# Patient Record
Sex: Female | Born: 1960
Health system: Southern US, Community
[De-identification: ages and names within clinical notes are randomized; demographics above are authoritative.]

## PROBLEM LIST (undated history)

## (undated) DIAGNOSIS — G4733 Obstructive sleep apnea (adult) (pediatric): Secondary | ICD-10-CM

## (undated) DIAGNOSIS — I10 Essential (primary) hypertension: Secondary | ICD-10-CM

## (undated) DIAGNOSIS — R112 Nausea with vomiting, unspecified: Secondary | ICD-10-CM

## (undated) DIAGNOSIS — M1990 Unspecified osteoarthritis, unspecified site: Secondary | ICD-10-CM

## (undated) DIAGNOSIS — L409 Psoriasis, unspecified: Secondary | ICD-10-CM

## (undated) DIAGNOSIS — F419 Anxiety disorder, unspecified: Secondary | ICD-10-CM

## (undated) DIAGNOSIS — E785 Hyperlipidemia, unspecified: Secondary | ICD-10-CM

## (undated) DIAGNOSIS — R7303 Prediabetes: Secondary | ICD-10-CM

## (undated) DIAGNOSIS — Z9889 Other specified postprocedural states: Secondary | ICD-10-CM

## (undated) DIAGNOSIS — M109 Gout, unspecified: Secondary | ICD-10-CM

## (undated) HISTORY — PX: SHOULDER SURGERY: SHX246

## (undated) HISTORY — PX: JOINT REPLACEMENT: SHX530

## (undated) HISTORY — PX: TONSILLECTOMY: SUR1361

## (undated) HISTORY — PX: INNER EAR SURGERY: SHX679

## (undated) HISTORY — PX: FOOT SURGERY: SHX648

## (undated) HISTORY — PX: CARPAL TUNNEL RELEASE: SHX101

---

## 2004-04-26 ENCOUNTER — Other Ambulatory Visit: Payer: Self-pay

## 2004-10-21 ENCOUNTER — Encounter: Payer: Self-pay | Admitting: Unknown Physician Specialty

## 2004-11-03 ENCOUNTER — Encounter: Payer: Self-pay | Admitting: Unknown Physician Specialty

## 2004-12-04 ENCOUNTER — Encounter: Payer: Self-pay | Admitting: Unknown Physician Specialty

## 2005-01-04 ENCOUNTER — Encounter: Payer: Self-pay | Admitting: Unknown Physician Specialty

## 2005-07-24 ENCOUNTER — Ambulatory Visit: Payer: Self-pay | Admitting: Neurosurgery

## 2011-04-05 ENCOUNTER — Emergency Department: Payer: Self-pay | Admitting: Internal Medicine

## 2011-07-19 ENCOUNTER — Ambulatory Visit: Payer: Self-pay | Admitting: Unknown Physician Specialty

## 2011-08-22 ENCOUNTER — Ambulatory Visit: Payer: Self-pay | Admitting: Unknown Physician Specialty

## 2011-08-24 ENCOUNTER — Ambulatory Visit: Payer: Self-pay | Admitting: Surgery

## 2012-02-22 ENCOUNTER — Ambulatory Visit: Payer: Self-pay | Admitting: Family

## 2012-11-04 ENCOUNTER — Ambulatory Visit: Payer: Self-pay | Admitting: Unknown Physician Specialty

## 2012-11-21 ENCOUNTER — Ambulatory Visit: Payer: Self-pay | Admitting: Unknown Physician Specialty

## 2012-11-21 LAB — URINALYSIS, COMPLETE
Bilirubin,UR: NEGATIVE
Glucose,UR: NEGATIVE mg/dL (ref 0–75)
Nitrite: NEGATIVE
Protein: NEGATIVE
RBC,UR: 1 /HPF (ref 0–5)
Specific Gravity: 1.008 (ref 1.003–1.030)
Squamous Epithelial: 4
WBC UR: 1 /HPF (ref 0–5)

## 2012-11-21 LAB — APTT: Activated PTT: 28.6 secs (ref 23.6–35.9)

## 2012-11-21 LAB — PROTIME-INR: Prothrombin Time: 11.7 secs (ref 11.5–14.7)

## 2012-11-21 LAB — CBC
HCT: 38.3 % (ref 35.0–47.0)
HGB: 12.6 g/dL (ref 12.0–16.0)
MCH: 30.2 pg (ref 26.0–34.0)
MCV: 92 fL (ref 80–100)
RBC: 4.19 10*6/uL (ref 3.80–5.20)
RDW: 13.3 % (ref 11.5–14.5)
WBC: 7 10*3/uL (ref 3.6–11.0)

## 2012-11-21 LAB — BASIC METABOLIC PANEL
Anion Gap: 6 — ABNORMAL LOW (ref 7–16)
BUN: 14 mg/dL (ref 7–18)
Chloride: 106 mmol/L (ref 98–107)
Creatinine: 0.45 mg/dL — ABNORMAL LOW (ref 0.60–1.30)
EGFR (African American): >60
EGFR (Non-African Amer.): >60
Osmolality: 277 (ref 275–301)
Potassium: 3.7 mmol/L (ref 3.5–5.1)

## 2012-11-26 ENCOUNTER — Inpatient Hospital Stay: Payer: Self-pay | Admitting: Unknown Physician Specialty

## 2012-11-26 LAB — ELECTROLYTE PANEL
Anion Gap: 11 (ref 7–16)
Chloride: 106 mmol/L (ref 98–107)
Co2: 23 mmol/L (ref 21–32)

## 2012-11-26 LAB — HEMOGLOBIN: HGB: 11.1 g/dL — ABNORMAL LOW (ref 12.0–16.0)

## 2012-11-27 LAB — BASIC METABOLIC PANEL
Anion Gap: 8 (ref 7–16)
BUN: 16 mg/dL (ref 7–18)
Calcium, Total: 7.8 mg/dL — ABNORMAL LOW (ref 8.5–10.1)
Chloride: 97 mmol/L — ABNORMAL LOW (ref 98–107)
Co2: 26 mmol/L (ref 21–32)
Creatinine: 0.61 mg/dL (ref 0.60–1.30)
Osmolality: 265 (ref 275–301)
Potassium: 3.8 mmol/L (ref 3.5–5.1)
Sodium: 131 mmol/L — ABNORMAL LOW (ref 136–145)

## 2012-11-28 LAB — BASIC METABOLIC PANEL
Anion Gap: 8 (ref 7–16)
BUN: 18 mg/dL (ref 7–18)
Calcium, Total: 8.3 mg/dL — ABNORMAL LOW (ref 8.5–10.1)
Chloride: 104 mmol/L (ref 98–107)
Co2: 24 mmol/L (ref 21–32)
Creatinine: 0.89 mg/dL (ref 0.60–1.30)
EGFR (Non-African Amer.): >60
Glucose: 129 mg/dL — ABNORMAL HIGH (ref 65–99)
Sodium: 136 mmol/L (ref 136–145)

## 2012-11-28 LAB — HEMOGLOBIN: HGB: 9 g/dL — ABNORMAL LOW (ref 12.0–16.0)

## 2014-08-18 ENCOUNTER — Ambulatory Visit (INDEPENDENT_AMBULATORY_CARE_PROVIDER_SITE_OTHER): Payer: 59 | Admitting: Podiatry

## 2014-08-18 ENCOUNTER — Ambulatory Visit (INDEPENDENT_AMBULATORY_CARE_PROVIDER_SITE_OTHER): Payer: 59

## 2014-08-18 ENCOUNTER — Encounter: Payer: Self-pay | Admitting: Podiatry

## 2014-08-18 VITALS — BP 176/159 | HR 83 | Resp 16 | Ht 66.0 in | Wt 215.0 lb

## 2014-08-18 DIAGNOSIS — M722 Plantar fascial fibromatosis: Secondary | ICD-10-CM

## 2014-08-18 DIAGNOSIS — M7660 Achilles tendinitis, unspecified leg: Secondary | ICD-10-CM

## 2014-08-18 MED ORDER — TRIAMCINOLONE ACETONIDE 10 MG/ML IJ SUSP
10.0000 mg | Freq: Once | INTRAMUSCULAR | Status: AC
  Administered 2014-08-18: 10 mg

## 2014-08-18 MED ORDER — DICLOFENAC SODIUM 75 MG PO TBEC: 75.0000 mg | DELAYED_RELEASE_TABLET | Freq: Two times a day (BID) | ORAL | Status: DC

## 2014-08-18 NOTE — Patient Instructions (Signed)

## 2014-08-18 NOTE — Progress Notes (Signed)
Subjective:     Patient ID: Norma Hickman, female   DOB: 1961/02/11, 53 y.o.   MRN: [id number]  Foot Pain   patient states I'm getting a lot of pain in the back of my right heel where it inserts into the bone that's been present for over a month. I've tried ultrasound ice therapy and physical therapy without relief of symptoms   Review of Systems  All other systems reviewed and are negative.      Objective:   Physical Exam  Nursing note and vitals reviewed. Constitutional: She is oriented to person, place, and time.  Cardiovascular: Intact distal pulses.   Musculoskeletal: Normal range of motion.  Neurological: She is oriented to person, place, and time.  Skin: Skin is warm.   neurovascular status intact with muscle strength adequate and range of motion of the subtalar and midtarsal joint within normal limits. Patient is found to have exquisite posterior heel pain on the posterior aspect of the heel medial side and into the central area with no pain on the lateral side noted. Patient's digits are well perfused arch height is normal and there is changes on x-ray     Assessment:     Severe acute Achilles tendinitis right with no involvement of the muscle    Plan:     H&P and x-rays reviewed. Today I carefully injected the medial side 3 mg dexamethasone Kenalog 5 mg Xylocaine after first discussing the risk of the injection and possibility for rupture. Patient wanted the injection which was accomplished and I also dispensed air fracture walker to use for the next 3 weeks and instructed on reduced activity. Placed on diclofenac 75 mg twice a day and reappoint in 3 weeks earlier if any issues should occur

## 2014-08-18 NOTE — Progress Notes (Signed)
   Subjective:    Patient ID: Norma Hickman, female    DOB: 07-Feb-1961, 53 y.o.   MRN: [id number]  HPI Comments: Its my right heel. Its very painful. The pain goes up the back of the leg. Ive had it for over 1 month. The pain is getting worse. Its hurts to walk. Lavenia Atlas been having therapy on my rt foot and it helps a little bit but it goes back to having pain.   Foot Pain      Review of Systems  HENT: Positive for hearing loss.   Musculoskeletal:       Joint pain Difficulty walking  All other systems reviewed and are negative.      Objective:   Physical Exam        Assessment & Plan:

## 2014-09-11 ENCOUNTER — Ambulatory Visit (INDEPENDENT_AMBULATORY_CARE_PROVIDER_SITE_OTHER): Payer: 59 | Admitting: Podiatry

## 2014-09-11 VITALS — BP 169/90 | HR 84 | Resp 16

## 2014-09-11 DIAGNOSIS — M7661 Achilles tendinitis, right leg: Secondary | ICD-10-CM

## 2014-09-11 DIAGNOSIS — M722 Plantar fascial fibromatosis: Secondary | ICD-10-CM

## 2014-09-11 NOTE — Progress Notes (Signed)
Subjective:     Patient ID: Norma Hickman, female   DOB: 08-09-1961, 53 y.o.   MRN: [id number]  HPI patient presents stating my heel is not better and only felt better in the boot and now was right back to where it was before   Review of Systems     Objective:   Physical Exam Neurovascular status intact muscle strength is adequate and patient is found to have continued severe discomfort on the posterior aspect of the right heel with large spur formation noted    Assessment:     Long-term history of Achilles tendinitis right which has failed to respond to conservative care    Plan:     Spent a great deal of time reviewing this and reviewing different options including continued cast immobilization versus open surgery versus shockwave therapy. Patient has opted for open surgery at this time due to the over a year history of this and her failure to do any type of activity because of pain. I allowed her to read a consent form and reviewed all possible complications as outlined on the consent form and the fact that the subcutaneous tissue is very thin on the back of the heel and the chance for dehiscence is high. Patient wants procedure understanding risk and understanding she'll be nonweightbearing for 6 weeks and total recovery. We'll take 6 months to one year. Patient is scheduled for outpatient surgery and is given all preoperative instructions and will bring her boot with her to surgery

## 2014-09-14 ENCOUNTER — Encounter: Payer: Self-pay | Admitting: Podiatry

## 2014-09-15 ENCOUNTER — Encounter: Payer: Self-pay | Admitting: Podiatry

## 2014-09-15 DIAGNOSIS — M7661 Achilles tendinitis, right leg: Secondary | ICD-10-CM

## 2014-09-15 DIAGNOSIS — M7731 Calcaneal spur, right foot: Secondary | ICD-10-CM

## 2014-09-21 NOTE — Progress Notes (Signed)
1. Tenolysis right achilles tendon with cross bridge anchor     2. Removal spur from back of right heel

## 2014-09-22 ENCOUNTER — Ambulatory Visit (INDEPENDENT_AMBULATORY_CARE_PROVIDER_SITE_OTHER): Payer: 59 | Admitting: Podiatry

## 2014-09-22 ENCOUNTER — Ambulatory Visit (INDEPENDENT_AMBULATORY_CARE_PROVIDER_SITE_OTHER): Payer: 59

## 2014-09-22 ENCOUNTER — Encounter: Payer: Self-pay | Admitting: Podiatry

## 2014-09-22 DIAGNOSIS — M7661 Achilles tendinitis, right leg: Secondary | ICD-10-CM

## 2014-09-22 NOTE — Progress Notes (Signed)
Subjective:     Patient ID: Norma Hickman, female   DOB: 1960/12/08, 53 y.o.   MRN: [id number]  HPI patient states I'm doing better than I thought and still having discomfort but it seems to be improving. Patient states that she's having no pain in her leg and has been nonweightbearing on this foot since surgery   Review of Systems     Objective:   Physical Exam Neurovascular status intact with muscle strength adequate and negative Homans sign noted and patient's noted to have well-healing surgical site right posterior heel with wound edges well coapted no drainage or gapping noted    Assessment:     Doing well post Teno lysis and heel spur resection right    Plan:     Reviewed condition and the importance of continued nonweightbearing. I did dispense a shorter air fracture walker as she is now past the critical stage and this will be easier to use since she is having knee replacement right performed in the past. Patient will be seen back in 2 weeks for suture removal and earlier if any other issues should occur like drainage increased swelling pain in the back of her leg or anything else associated with the procedure area

## 2014-10-09 ENCOUNTER — Ambulatory Visit (INDEPENDENT_AMBULATORY_CARE_PROVIDER_SITE_OTHER): Payer: 59 | Admitting: Podiatry

## 2014-10-09 VITALS — BP 178/104 | HR 79 | Resp 16

## 2014-10-09 DIAGNOSIS — M722 Plantar fascial fibromatosis: Secondary | ICD-10-CM

## 2014-10-09 DIAGNOSIS — M7661 Achilles tendinitis, right leg: Secondary | ICD-10-CM

## 2014-10-09 NOTE — Progress Notes (Signed)
Subjective:     Patient ID: Norma BallsVickie L Hickman, female   DOB: January 11, 1961, 53 y.o.   MRN: [id number]  HPIpatient states she's doing really well with her right foot and states it's healing well and her foot feels much better than it did before surgery   Review of Systems     Objective:   Physical Exam Neurovascular status intact with well-healing posterior incision site right heel with Achilles tendon which is in very good condition and no indications of trauma to    Assessment:     Doing well post Te no lysis and heel spur resection right with anchor system    Plan:     Reviewed condition and at this time I removed stitches with wound edges remain coapted well and applied sterile dressing. Instructed on continued reduced weightbearing with gradual pressure to be put on the foot but no activation of the Achilles tendon the next 4 weeks. Reappoint 4 weeks or earlier if any issues should occur

## 2014-10-12 ENCOUNTER — Ambulatory Visit (INDEPENDENT_AMBULATORY_CARE_PROVIDER_SITE_OTHER): Payer: 59 | Admitting: *Deleted

## 2014-10-12 VITALS — BP 178/104 | HR 79 | Resp 16

## 2014-10-12 DIAGNOSIS — M7661 Achilles tendinitis, right leg: Secondary | ICD-10-CM

## 2014-10-12 NOTE — Progress Notes (Signed)
PT PRESENTS FOR ONE SUTURE TO BE REMOVED. PT WAS IN OFFICE ON 11.6.15 AND ONE SUTURE WAS MISSED.

## 2014-11-06 ENCOUNTER — Ambulatory Visit (INDEPENDENT_AMBULATORY_CARE_PROVIDER_SITE_OTHER): Payer: 59

## 2014-11-06 ENCOUNTER — Ambulatory Visit (INDEPENDENT_AMBULATORY_CARE_PROVIDER_SITE_OTHER): Payer: 59 | Admitting: Podiatry

## 2014-11-06 VITALS — BP 112/90 | HR 86 | Resp 16

## 2014-11-06 DIAGNOSIS — M7661 Achilles tendinitis, right leg: Secondary | ICD-10-CM

## 2014-11-08 NOTE — Progress Notes (Signed)
Subjective:     Patient ID: Norma BallsVickie L Nee, female   DOB: 1961/08/02, 53 y.o.   MRN: [id number]  HPI patient states that her right heel is doing very well and she's walking reasonably well with minimal discomfort or swelling   Review of Systems     Objective:   Physical Exam Neurovascular status intact with well-healing surgical site right with a small amount of distal crust formation that's localized with no redness erythema or drainage noted surrounding the area. The range of motion of the ankle is good and the Achilles tendon appear strong    Assessment:     Doing well post heel spur resection with T no lysis of the Achilles tendon    Plan:     Advised this patient on continued watching the wound itself and keeping it nice and clean and padded. Dispensed anklet and gradual return to soft shoe and continued range of motion exercises but no forms of physical activity at this current time. Reviewed x-rays and reappoint in 6 weeks

## 2014-12-18 ENCOUNTER — Encounter: Payer: 59 | Admitting: Podiatry

## 2014-12-29 ENCOUNTER — Ambulatory Visit (INDEPENDENT_AMBULATORY_CARE_PROVIDER_SITE_OTHER): Payer: 59

## 2014-12-29 ENCOUNTER — Ambulatory Visit (INDEPENDENT_AMBULATORY_CARE_PROVIDER_SITE_OTHER): Payer: 59 | Admitting: Podiatry

## 2014-12-29 VITALS — BP 166/96 | HR 95 | Resp 16

## 2014-12-29 DIAGNOSIS — M7661 Achilles tendinitis, right leg: Secondary | ICD-10-CM

## 2014-12-30 NOTE — Progress Notes (Signed)
Subjective:     Patient ID: Norma BallsVickie L Sutch, female   DOB: May 05, 1961, 54 y.o.   MRN: [id number]  HPI patient states my right heel is doing real well   Review of Systems     Objective:   Physical Exam  Neurovascular status intact with muscle strength adequate and range of motion within normal limits. Patient is noted to have discomfort of a very minimal nature more proximal but the incision site itself is healed excellent with good strength of the Achilles tendon    Assessment:     Doing well after her Achilles tendon surgery right with posterior heel resection    Plan:     Final x-rays reviewed and allow patient to return to normal activities. Reappoint to recheck

## 2015-03-26 NOTE — Op Note (Signed)
PATIENT NAME:  Norma Hickman, Norma Hickman MR#:  [id number] DATE OF BIRTH:  Nov 10, 1961  DATE OF PROCEDURE:  11/26/2012  PREOPERATIVE DIAGNOSIS: Severe degenerative arthritis, right knee.   POSTOPERATIVE DIAGNOSIS: Severe degenerative arthritis, right knee.   PROCEDURE PERFORMED: Stryker PS total knee replacement on the right.   SURGEON: Alda Berthold., M.D.   ANESTHESIA: Spinal.   SUMMARY OF IMPLANTS USED:  1.  #7 Standard tibial tray,  2.  #7 posterior stabilized femoral component. 3.  #7, 10 mm resurfacing patellar component. 4.  #7, 10-mm PS tibial bearing insert.   HISTORY: The patient had a long history of persistent degenerative arthritis of her right knee. She had not responded to conservative treatment. She was brought in for a total knee replacement due to her increasing discomfort.   PROCEDURE IN DETAIL: The patient was taken to the Operating Room where satisfactory spinal anesthesia was achieved. Following this, a tourniquet was applied to the patient's right upper thigh and the right lower extremity was prepped and draped in the usual fashion for a total knee procedure.   The patient was given 2 grams Kefzol prior to the start of the procedure.   Next, the right lower extremity was exsanguinated and the tourniquet was inflated. A slightly curved longitudinal incision was made over the anterior aspect of the right knee joint. Dissection was carried down through the subcutaneous tissue onto the extensor mechanism. The vastus medialis was divided in line with its fibers about a fingerbreadth above the superomedial border of the patella. The joint was entered. Dissection was carried along the medial border of the patella and medial border of the patellar tendon.   The patella was then everted and dislocated laterally. The knee was flexed. There was significant erosion of her medial and lateral femoral condyles plus patellofemoral area. There were some osteophytes about the distal  femur, which I excised. I then made a five-sixteenths hole in the trochlear groove about 1 cm anterior to the insertion of the PCL on the distal femur. An intramedullary rod was inserted. On the distal end of the rod was an alignment jig and cutting block. After the alignment jig was put in position, the cutting block was fixed to the distal femur with smooth pins. The alignment jig and the intramedullary rod were removed. I then made my distal femoral cut. I then templated the distal femur for a #7 femoral component. Starting holes were made for the cutting block. They were placed in slight external rotation. The #7 cutting block was then impacted on the distal femur and the anterior and posterior cuts were made along with the anterior and posterior chamfer cuts. I then notched out the intercondylar area using the appropriate jig. The ACL and PCL were removed at this time. The knee was hyperflexed. With the knee hyperflexed I was able to excise the meniscal remnants.  I then made a five-sixteenths inch hole in the proximal tibia in the midline at the junction of the anterior one-third and posterior two-thirds. The intramedullary rod was inserted. On the proximal end of the rod was a 5 degree sloped cutting block. I used a stylus to reference off the lowest portion of the medial tibial plateau. I then aligned the cutting block. Next, it was pinned to the proximal tibia with smooth pins and then I removed the stylus and intramedullary rod. The proximal tibial cut was made without difficulty. Next, I went ahead and sized the proximal tibia for a #7 tibial baseplate. The  trial components were inserted. With the trial components in place, I was able to insert an 8 mm spacer. I went ahead and removed 2 mm more of bone from the proximal tibia, which  allowed me to insert a 10 mm spacer. With the trial components in place, the knee did come to full extension and flexed well. It seemed to be stable.   I might add that I  did slightly elevate the posterior capsule from the distal femur, but did not have to remove any significant osteophytes from the posterior aspect of the distal femur.   Next, I went ahead and trialed the patella. It seemed that a #7 resurfacing patella was going to be the appropriate size, so I went ahead and removed 8 mm of bone from the retropatellar surface. Holes were made for the trial and with the  number #7 10-mm thick resurfacing patella in place the patella tracked well. All the trial components were removed at this time, except the tibial baseplate, which I used as a guide for my cruciate cut in the proximal tibia.   I then used jet lavage to remove blood from the cut cancellous surface. The permanent components were then inserted. I then impacted a #7 standard tibial tray on the proximal tibia with methylmethacrylate and impacted the #7 PS femoral component onto the distal femur with methylmethacrylate. Excess glue was removed. I then inserted an 8 mm spacer and extended the knee. After this I impacted the #7 10-mm thick resurfacing patellar component onto the retropatellar surface with methylmethacrylate. Again, excess glue was removed.   I then removed the trial spacer and impacted the 10 mm# 7 tibial bearing insert onto the tibial baseplate.   The knee was carried through a range of motion. Again, it moved well and was quite stable. Full extension was achieved.   The tourniquet was released at this time. It had been up 135 minutes. Bleeding was controlled with coagulation cautery. The wound was irrigated with copious amounts of saline.   I went ahead and inserted two Autovac tubes in the depths of the wound and then I closed the extensor mechanism with #1 Ethibond and #1 Vicryl sutures, and the subcutaneous tissue with 0 and 2-0 Vicryl and the skin with skin staples. Betadine was applied to the wound, followed by a sterile dressing. I did place four TENS pads at the incision. Cooling pads  were placed around the patient's right knee and then a knee immobilizer was applied.  The patient was then  transferred to her hospital bed and taken to the recovery room in satisfactory condition. Blood loss was about 300 to 400 mL.     ____________________________ Alda BertholdHarold B. Hansen Carino Jr., MD hbk:eg D: 12/01/2012 16:36:41 ET T: 12/01/2012 18:38:16 ET JOB#: [id number]  cc: Alda BertholdHarold B. Nikkol Pai Jr., MD, <Dictator> Randon GoldsmithHAROLD B Africa Masaki, Montez HagemanJR MD ELECTRONICALLY SIGNED 01/02/2013 13:44

## 2015-03-26 NOTE — Discharge Summary (Signed)
PATIENT NAME:  Norma Hickman, Norma Hickman MR#:  [id number] DATE OF BIRTH:  [birth year]  DATE OF ADMISSION:  11/26/2012 DATE OF DISCHARGE:  11/29/2012  OPERATION: On 11/26/2012, she had a right total knee arthroplasty. Surgeon: Alda BertholdHarold B. [facility] Jr., MD. Anesthesia: Spinal with femoral nerve block. Estimated blood loss: 400 mL Tourniquet time: 135 minutes. Drains: Autovac. Implants: Stryker. Complications: None.   HISTORY OF PRESENT ILLNESS: The patient is a 54 year old white female who had x-rays and MRIs consistent with tricompartmental arthritic changes. She has tried conservative measures, including knee arthroscopy. She has also had a steroid injection. Because her continued increased pain, she consented for total knee arthroplasty.   PHYSICAL EXAMINATION:  HEENT: Benign.  LUNGS: Clear to auscultation.  HEART: Normal sinus rhythm without murmur.  MUSCULOSKELETAL: Right knee has diffuse swelling, tenderness to palpation of the medial and lateral joint lines. McMurray test produced pain medially and laterally. Full extension of the right knee with flexion to 110 to 115. Minimal patellar tenderness. No retropatellar crepitus. Ligamentous exam is stable with good alignment. No pain with hip range of motion. No obvious neurovascular deficit.   HOSPITAL COURSE: After admission on 11/26/2012, she underwent total knee arthroplasty on the same day. She had good pain control afterwards and was brought to the orthopedic floor from the PACU. Her initial hemoglobin on 11/27/2012 was 10.1. She did not have physical therapy that day due to staffing. On postoperative day 2, 11/28/2012, she had a hemoglobin 9.0. She had physical therapy on that day. On postoperative day 3, on 11/29/2012, she had a T-max of 101.4, which was normal in the morning. She complained of decreased sensation in the right foot. At the end of the day she was seen by Cranston Neighborhris Gaines, who felt she was stable for discharge and discharged her to home.    DISCHARGE MEDICATIONS:  1. Lovenox 40 mg injectable, 1 injection subcutaneous once daily.  2. Oxycodone 5 mg, 1 tab p.o. q. 3 to 4 hours p.r.n. pain.  3. Tramadol 50 mg, 1 tab p.o. b.i.d. p.r.n. (hold while on Vicodin).  4. Gabapentin 300 mg 1 cap p.o. b.i.d.  5. Crestor 10 mg 1 tab p.o. daily.  6. Lisinopril 20 mg 1 tab p.o. once daily.  7. Sertraline 50 mg 1 tab p.o.      DISCHARGE INSTRUCTIONS:  1. The [facility] in 2 weeks.  2. She will do physical therapy and weight bear as tolerated on the left lower extremity.  3. Her diet is regular.  4. Her TED hose will be used knee-high bilaterally.  5. Dressing will be changed once daily and on an as-needed basis.   ____________________________ Bralynn Velador M. Haskel KhanBerndt, NP amb:cb D: 12/11/2012 15:06:59 ET T: 12/11/2012 15:19:18 ET JOB#: [id number]  cc: Kaliyah Gladman M. Haskel KhanBerndt, NP, <Dictator>

## 2015-03-26 NOTE — Discharge Summary (Signed)
PATIENT NAME:  Norma Hickman, Norma Hickman MR#:  [id number] DATE OF BIRTH:  [birth year]  DATE OF ADMISSION:  11/26/2012 DATE OF DISCHARGE:  11/29/2012  ADMITTING DIAGNOSIS: Osteoarthritis right knee.  DISCHARGE DIAGNOSIS: Osteoarthritis right knee.  OPERATION: On 11/26/2012, she had a right total knee arthroplasty. Surgeon: Alda BertholdHarold B. Kernodle Jr., MD. Anesthesia: Spinal with femoral nerve block. Estimated blood loss: 400 mL Tourniquet time: 135 minutes. Drains: Autovac. Implants: Stryker. Complications: None.   HISTORY OF PRESENT ILLNESS: The patient is a 54 year old white female who had x-rays and MRIs consistent with tricompartmental arthritic changes. She has tried conservative measures, including knee arthroscopy. She has also had a steroid injection. Because her continued increased pain, she consented for total knee arthroplasty.   PHYSICAL EXAMINATION:  HEENT: Benign.  LUNGS: Clear to auscultation.  HEART: Normal sinus rhythm without murmur.  MUSCULOSKELETAL: Right knee has diffuse swelling, tenderness to palpation of the medial and lateral joint lines. McMurray test produced pain medially and laterally. Full extension of the right knee with flexion to 110 to 115. Minimal patellar tenderness. No retropatellar crepitus. Ligamentous exam is stable with good alignment. No pain with hip range of motion. No obvious neurovascular deficit.   HOSPITAL COURSE: After admission on 11/26/2012, she underwent total knee arthroplasty on the same day. She had good pain control afterwards and was brought to the orthopedic floor from the PACU. Her initial hemoglobin on 11/27/2012 was 10.1. She did not have physical therapy that day due to staffing. On postoperative day 2, 11/28/2012, she had a hemoglobin 9.0. She had physical therapy on that day. On postoperative day 3, on 11/29/2012, she had a T-max of 101.4, which was normal in the morning. She complained of decreased sensation in the right foot. At the end of the day  she was seen by Cran[place] Neighborhris Gaines, who felt she was stable for discharge and discharged her to home.   DISCHARGE MEDICATIONS:  1. Lovenox 40 mg injectable, 1 injection subcutaneous once daily.  2. Oxycodone 5 mg, 1 tab p.o. q. 3 to 4 hours p.r.n. pain.  3. Tramadol 50 mg, 1 tab p.o. b.i.d. p.r.n. (hold while on Vicodin).  4. Gabapentin 300 mg 1 cap p.o. b.i.d.  5. Crestor 10 mg 1 tab p.o. daily.  6. Lisinopril 20 mg 1 tab p.o. once daily.  7. Sertraline 50 mg 1 tab p.o.      DISCHARGE INSTRUCTIONS:  1. The patient will [facility] for staple removal in 2 weeks.  2. She will do physical therapy and weight bear as tolerated on the left lower extremity.  3. Her diet is regular.  4. Her TED hose will be used knee-high bilaterally.  5. Dressing will be changed once daily and on an as-needed basis.   ____________________________ [name], NP amb:cb D: 12/11/2012 15:06:00 ET T: 12/11/2012 15:19:18 ET JOB#: [id number]  cc: [name], NP, <Dictator> Burt EkAPRIL M Diontay Rosencrans [name] ELECTRONICALLY SIGNED 12/20/2012 14:39

## 2016-04-19 DIAGNOSIS — H5203 Hypermetropia, bilateral: Secondary | ICD-10-CM | POA: Diagnosis not present

## 2016-04-19 DIAGNOSIS — H52223 Regular astigmatism, bilateral: Secondary | ICD-10-CM | POA: Diagnosis not present

## 2016-04-19 DIAGNOSIS — H524 Presbyopia: Secondary | ICD-10-CM | POA: Diagnosis not present

## 2017-06-27 DIAGNOSIS — I10 Essential (primary) hypertension: Secondary | ICD-10-CM | POA: Diagnosis not present

## 2017-06-27 DIAGNOSIS — M545 Low back pain: Secondary | ICD-10-CM | POA: Diagnosis not present

## 2017-06-27 DIAGNOSIS — R030 Elevated blood-pressure reading, without diagnosis of hypertension: Secondary | ICD-10-CM | POA: Diagnosis not present

## 2017-06-27 DIAGNOSIS — N390 Urinary tract infection, site not specified: Secondary | ICD-10-CM | POA: Diagnosis not present

## 2017-06-27 DIAGNOSIS — R300 Dysuria: Secondary | ICD-10-CM | POA: Diagnosis not present

## 2017-06-27 DIAGNOSIS — R399 Unspecified symptoms and signs involving the genitourinary system: Secondary | ICD-10-CM | POA: Diagnosis not present

## 2017-06-27 DIAGNOSIS — I169 Hypertensive crisis, unspecified: Secondary | ICD-10-CM | POA: Diagnosis not present

## 2017-07-23 DIAGNOSIS — M1712 Unilateral primary osteoarthritis, left knee: Secondary | ICD-10-CM | POA: Diagnosis not present

## 2017-07-23 DIAGNOSIS — M25562 Pain in left knee: Secondary | ICD-10-CM | POA: Diagnosis not present

## 2017-08-16 DIAGNOSIS — I10 Essential (primary) hypertension: Secondary | ICD-10-CM | POA: Diagnosis not present

## 2017-08-16 DIAGNOSIS — E782 Mixed hyperlipidemia: Secondary | ICD-10-CM | POA: Diagnosis not present

## 2017-08-16 DIAGNOSIS — Z7689 Persons encountering health services in other specified circumstances: Secondary | ICD-10-CM | POA: Diagnosis not present

## 2017-08-16 DIAGNOSIS — E669 Obesity, unspecified: Secondary | ICD-10-CM | POA: Diagnosis not present

## 2017-09-11 DIAGNOSIS — E669 Obesity, unspecified: Secondary | ICD-10-CM | POA: Diagnosis not present

## 2017-09-11 DIAGNOSIS — M1712 Unilateral primary osteoarthritis, left knee: Secondary | ICD-10-CM | POA: Diagnosis not present

## 2017-09-18 DIAGNOSIS — R7309 Other abnormal glucose: Secondary | ICD-10-CM | POA: Diagnosis not present

## 2017-09-18 DIAGNOSIS — I10 Essential (primary) hypertension: Secondary | ICD-10-CM | POA: Diagnosis not present

## 2017-09-18 DIAGNOSIS — E669 Obesity, unspecified: Secondary | ICD-10-CM | POA: Diagnosis not present

## 2017-09-18 DIAGNOSIS — E785 Hyperlipidemia, unspecified: Secondary | ICD-10-CM | POA: Diagnosis not present

## 2017-10-02 DIAGNOSIS — L400 Psoriasis vulgaris: Secondary | ICD-10-CM | POA: Diagnosis not present

## 2017-12-31 DIAGNOSIS — I10 Essential (primary) hypertension: Secondary | ICD-10-CM | POA: Diagnosis not present

## 2017-12-31 DIAGNOSIS — R7303 Prediabetes: Secondary | ICD-10-CM | POA: Diagnosis not present

## 2017-12-31 DIAGNOSIS — E785 Hyperlipidemia, unspecified: Secondary | ICD-10-CM | POA: Diagnosis not present

## 2017-12-31 DIAGNOSIS — E669 Obesity, unspecified: Secondary | ICD-10-CM | POA: Diagnosis not present

## 2018-02-04 DIAGNOSIS — L400 Psoriasis vulgaris: Secondary | ICD-10-CM | POA: Diagnosis not present

## 2018-04-25 DIAGNOSIS — L400 Psoriasis vulgaris: Secondary | ICD-10-CM | POA: Diagnosis not present

## 2018-04-25 DIAGNOSIS — L239 Allergic contact dermatitis, unspecified cause: Secondary | ICD-10-CM | POA: Diagnosis not present

## 2018-05-28 DIAGNOSIS — R58 Hemorrhage, not elsewhere classified: Secondary | ICD-10-CM | POA: Diagnosis not present

## 2018-05-28 DIAGNOSIS — L400 Psoriasis vulgaris: Secondary | ICD-10-CM | POA: Diagnosis not present

## 2018-05-28 DIAGNOSIS — L918 Other hypertrophic disorders of the skin: Secondary | ICD-10-CM | POA: Diagnosis not present

## 2018-05-28 DIAGNOSIS — L408 Other psoriasis: Secondary | ICD-10-CM | POA: Diagnosis not present

## 2018-05-28 DIAGNOSIS — Z5181 Encounter for therapeutic drug level monitoring: Secondary | ICD-10-CM | POA: Diagnosis not present

## 2018-05-28 DIAGNOSIS — L538 Other specified erythematous conditions: Secondary | ICD-10-CM | POA: Diagnosis not present

## 2018-05-28 DIAGNOSIS — L239 Allergic contact dermatitis, unspecified cause: Secondary | ICD-10-CM | POA: Diagnosis not present

## 2018-06-18 ENCOUNTER — Telehealth: Payer: Self-pay | Admitting: Pharmacist

## 2018-06-18 NOTE — Telephone Encounter (Signed)
Called patient to schedule an appointment for the [employer] [facility]. I was unable to reach the patient so I left a HIPAA-compliant message requesting that the patient return my call.   

## 2018-06-24 ENCOUNTER — Ambulatory Visit (INDEPENDENT_AMBULATORY_CARE_PROVIDER_SITE_OTHER): Payer: Self-pay | Admitting: Pharmacist

## 2018-06-24 DIAGNOSIS — Z79899 Other long term (current) drug therapy: Secondary | ICD-10-CM

## 2018-06-24 MED ORDER — ADALIMUMAB 80 MG/0.8ML & 40MG/0.4ML ~~LOC~~ PNKT: 80.0000 mg | PEN_INJECTOR | Freq: Once | SUBCUTANEOUS | 0 refills | Status: DC

## 2018-06-24 MED ORDER — ADALIMUMAB 40 MG/0.4ML ~~LOC~~ AJKT: 40.0000 mg | AUTO-INJECTOR | SUBCUTANEOUS | 0 refills | Status: DC

## 2018-06-24 NOTE — Progress Notes (Signed)
   S: [facility].  Patient is currently taking Humira for psoriasis. Patient is managed by Wynelle BeckmannBrenda Sandridge, PA (dermatology) for this.   She reports that she has been reviewing the medication information on her own. She has a brother who is on Humira and feels comfortable taking it.   Adherence: has not started yet  Efficacy: has not started yet  Dosing:   Psoriatic arthritis: SubQ: 40 mg every other week (may continue methotrexate, other nonbiologic DMARDS, corticosteroids, NSAIDs and/or analgesics)  Dose adjustments: Renal: no dose adjustments (has not been studied) Hepatic: no dose adjustments (has not been studied)   Screening: TB test: completed per patient Hepatitis: completed per patient  Monitoring: S/sx of infection: denies CBC: got blood work last week. Reports WNL S/sx of hypersensitivity: has not started S/sx of malignancy: denies S/sx of heart failure: denies  O:     Lab Results  Component Value Date   WBC 7.0 11/21/2012   HGB 9.0 (L) 11/28/2012   HCT 38.3 11/21/2012   MCV 92 11/21/2012   PLT 237 11/21/2012      Chemistry      Component Value Date/Time   NA 136 11/28/2012 0443   K 3.7 11/28/2012 0443   CL 104 11/28/2012 0443   CO2 24 11/28/2012 0443   BUN 18 11/28/2012 0443   CREATININE 0.89 11/28/2012 0443      Component Value Date/Time   CALCIUM 8.3 (L) 11/28/2012 0443       A/P: 1. Medication review: Patient currently on Humira for psoriasis but she has not started it yet. Reviewed the medication with the patient, including the following: Humira is a TNF blocking agent indicated for ankylosing spondylitis, Crohn's disease, Hidradenitis suppurativa, psoriatic arthritis, plaque psoriasis, ulcerative colitis, and uveitis. Patient educated on purpose, proper use and potential adverse effects of Humira. The most common adverse effects are infections, headache, and  injection site reactions. There is the possibility of an increased risk of malignancy but it is not well understood if this increased risk is due to there medication or the disease state. There are rare cases of pancytopenia and aplastic anemia. No recommendations for any changes at this time.   Alvino BloodStacey Karl Talvin Christianson, PharmD, BCPS, BCACP, CPP Clinical Pharmacist Practitioner  [phone]

## 2018-07-12 MED FILL — HUMIRA PEN 40 MG/0.4ML PNKT: 40 | 28 days supply | Qty: 2 | Fill #0

## 2018-07-22 ENCOUNTER — Telehealth: Payer: Self-pay | Admitting: Pharmacist

## 2018-07-22 NOTE — Telephone Encounter (Signed)
[facility] Called patient and left VM making her aware.

## 2018-07-22 NOTE — Telephone Encounter (Signed)
Patient called wanting to know why she could not get [facility]. Called the pharmacy and it is an insurance issue and they are getting rejections for it. The pharmacist there will work on it to try to get it approved by insurance and will call me back with an update and will call the patient as well. Left HIPAA compliant VM with patient letting her know we were working on it.

## 2018-08-15 ENCOUNTER — Other Ambulatory Visit: Payer: Self-pay | Admitting: Pharmacist

## 2018-08-15 MED ORDER — ADALIMUMAB 40 MG/0.4ML ~~LOC~~ AJKT: 40.0000 mg | AUTO-INJECTOR | SUBCUTANEOUS | 4 refills | Status: DC

## 2018-08-15 MED FILL — HUMIRA PEN 40 MG/0.4ML PNKT: 40 | 28 days supply | Qty: 2 | Fill #0

## 2018-09-12 MED FILL — HUMIRA PEN 40 MG/0.4ML PNKT: 40 | 28 days supply | Qty: 2 | Fill #1

## 2018-10-14 MED FILL — HUMIRA PEN 40 MG/0.4ML PNKT: 40 | 28 days supply | Qty: 2 | Fill #2

## 2018-11-13 ENCOUNTER — Ambulatory Visit: Payer: Self-pay | Admitting: Physician Assistant

## 2018-11-13 ENCOUNTER — Encounter: Payer: Self-pay | Admitting: Physician Assistant

## 2018-11-13 VITALS — BP 134/82 | HR 110 | Temp 98.8°F | Wt 234.0 lb

## 2018-11-13 DIAGNOSIS — R05 Cough: Secondary | ICD-10-CM

## 2018-11-13 DIAGNOSIS — R059 Cough, unspecified: Secondary | ICD-10-CM

## 2018-11-13 DIAGNOSIS — J101 Influenza due to other identified influenza virus with other respiratory manifestations: Secondary | ICD-10-CM

## 2018-11-13 LAB — POCT INFLUENZA A/B
Influenza A, POC: POSITIVE — AB
Influenza B, POC: NEGATIVE

## 2018-11-13 MED ORDER — BENZONATATE 200 MG PO CAPS: 200.0000 mg | ORAL_CAPSULE | Freq: Two times a day (BID) | ORAL | 0 refills | Status: DC | PRN

## 2018-11-13 MED ORDER — PROMETHAZINE-DM 6.25-15 MG/5ML PO SYRP: 5.0000 mL | ORAL_SOLUTION | Freq: Four times a day (QID) | ORAL | 0 refills | Status: DC | PRN

## 2018-11-13 MED ORDER — OSELTAMIVIR PHOSPHATE 75 MG PO CAPS: 75.0000 mg | ORAL_CAPSULE | Freq: Two times a day (BID) | ORAL | 0 refills | Status: AC
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
## 2018-11-13 NOTE — Patient Instructions (Signed)
Thank you for choosing [facility] for your health care needs.  You have been diagnosed with influenza (the Flu).  You tested positive on the rapid flu test, performed in the clinic, for strain A.  Recommend increase fluids; water, Gatorade, or hot tea with lemon/honey. Take over the counter ibuprofen for fever and body aches. Rest. May use humidifier in bedroom. Use several pillows to prop yourself up, when sleeping.  You have been prescribed Tamiflu: 1 tab twice a day x 5 days. You have also been prescribed Tesssalon Perles and Promethazine-DM cough syrup.  Return to clinic in one week if symptoms not improving, sooner with any worsening symptoms.  Influenza, Adult Influenza ("the flu") is an infection in the lungs, nose, and throat (respiratory tract). It is caused by a virus. The flu causes many common cold symptoms, as well as a high fever and body aches. It can make you feel very sick. The flu spreads easily from person to person (is contagious). Getting a flu shot (influenza vaccination) every year is the best way to prevent the flu. Follow these instructions at home:  Take over-the-counter and prescription medicines only as told by your doctor.  Use a cool mist humidifier to add moisture (humidity) to the air in your home. This can make it easier to breathe.  Rest as needed.  Drink enough fluid to keep your pee (urine) clear or pale yellow.  Cover your mouth and nose when you cough or sneeze.  Wash your hands with soap and water often, especially after you cough or sneeze. If you cannot use soap and water, use hand sanitizer.  Stay home from work or school as told by your doctor. Unless you are visiting your doctor, try to avoid leaving home until your fever has been gone for 24 hours without the use of medicine.  Keep all follow-up visits as told by your doctor. This is important. How is this prevented?  Getting a yearly (annual) flu shot is the best way to avoid getting  the flu. You may get the flu shot in late summer, fall, or winter. Ask your doctor when you should get your flu shot.  Wash your hands often or use hand sanitizer often.  Avoid contact with people who are sick during cold and flu season.  Eat healthy foods.  Drink plenty of fluids.  Get enough sleep.  Exercise regularly. Contact a doctor if:  You get new symptoms.  You have: ? Chest pain. ? Watery poop (diarrhea). ? A fever.  Your cough gets worse.  You start to have more mucus.  You feel sick to your stomach (nauseous).  You throw up (vomit). Get help right away if:  You start to be short of breath or have trouble breathing.  Your skin or nails turn a bluish color.  You have very bad pain or stiffness in your neck.  You get a sudden headache.  You get sudden pain in your face or ear.  You cannot stop throwing up. This information is not intended to replace advice given to you by your health care provider. Make sure you discuss any questions you have with your health care provider. Document Released: 08/29/2008 Document Revised: 04/27/2016 Document Reviewed: 09/14/2015 Elsevier Interactive Patient Education  2017 [employer]

## 2018-11-15 ENCOUNTER — Telehealth: Payer: Self-pay | Admitting: Emergency Medicine

## 2018-11-15 NOTE — Telephone Encounter (Signed)
Spoke with patient stated that she is feeling alittle better still taking Tamiflu,cough med. prescribed

## 2018-11-15 NOTE — Telephone Encounter (Signed)
Left message follow up call from visit with [facility]. 

## 2018-11-18 MED FILL — HUMIRA PEN 40 MG/0.4ML PNKT: 40 | 28 days supply | Qty: 2 | Fill #3

## 2018-12-26 DIAGNOSIS — L400 Psoriasis vulgaris: Secondary | ICD-10-CM | POA: Diagnosis not present

## 2019-01-24 ENCOUNTER — Ambulatory Visit (INDEPENDENT_AMBULATORY_CARE_PROVIDER_SITE_OTHER): Payer: Self-pay | Admitting: Pharmacist

## 2019-01-24 ENCOUNTER — Encounter: Payer: Self-pay | Admitting: Pharmacist

## 2019-01-24 DIAGNOSIS — Z79899 Other long term (current) drug therapy: Secondary | ICD-10-CM

## 2019-01-24 MED ORDER — RISANKIZUMAB-RZAA(150 MG DOSE) 75 MG/0.83ML ~~LOC~~ PSKT: 2.0000 | PREFILLED_SYRINGE | SUBCUTANEOUS | 5 refills | Status: DC

## 2019-01-24 MED FILL — SKYRIZI (150 MG DOSE) 75 MG: 75 | 28 days supply | Qty: 1 | Fill #0

## 2019-01-24 NOTE — Progress Notes (Signed)
   S: [facility] medication therapy.  Patient is currently taking Skyrizi for psoriasis. Patient is managed by Dr. Margarita Grizzle for this.   Adherence: has not started yet  Efficacy: has not started yet. Humira was working for her but she kept breaking out so that is why she is switching.  Dosing: Plaque psoriasis, moderate to severe: SubQ: Two consecutive injections (75 mg each) for a total dose of 150 mg at weeks 0, 4, and then every 12 weeks thereafter.  Screening: TB test: completed per patient  Monitoring: S/sx of infection: denies, did have the flu a few months ago S/sx of hypersensitivity/injection site reaction: has not started yet.   O:     Lab Results  Component Value Date   WBC 7.0 11/21/2012   HGB 9.0 (L) 11/28/2012   HCT 38.3 11/21/2012   MCV 92 11/21/2012   PLT 237 11/21/2012      Chemistry      Component Value Date/Time   NA 136 11/28/2012 0443   K 3.7 11/28/2012 0443   CL 104 11/28/2012 0443   CO2 24 11/28/2012 0443   BUN 18 11/28/2012 0443   CREATININE 0.89 11/28/2012 0443      Component Value Date/Time   CALCIUM 8.3 (L) 11/28/2012 0443       A/P: 1. Medication review: Patient currently on Skyrizi for psoriasis. Reviewed the medication with the patient, including the following: Cristy Folks is a monoclonal antibody used in the treatment of psoriasis. Patient educated on purpose, proper use and potential adverse effects of Skyrizi. Possible adverse effects are infections, headache, and injection site reactions. Live vaccinations should be avoided while on therapy. No recommendations for any changes.     Alvino Blood, PharmD, BCPS, BCACP, CPP Clinical Pharmacist Practitioner  [phone]

## 2019-02-24 MED FILL — SKYRIZI (150 MG DOSE) 75 MG: 75 | 28 days supply | Qty: 1 | Fill #1

## 2019-05-21 MED FILL — SKYRIZI (150 MG DOSE) 75 MG: 75 | 28 days supply | Qty: 1 | Fill #2

## 2019-06-27 DIAGNOSIS — L400 Psoriasis vulgaris: Secondary | ICD-10-CM | POA: Diagnosis not present

## 2019-06-27 DIAGNOSIS — L2089 Other atopic dermatitis: Secondary | ICD-10-CM | POA: Diagnosis not present

## 2019-07-09 DIAGNOSIS — H524 Presbyopia: Secondary | ICD-10-CM | POA: Diagnosis not present

## 2019-08-21 DIAGNOSIS — L400 Psoriasis vulgaris: Secondary | ICD-10-CM | POA: Diagnosis not present

## 2019-10-10 DIAGNOSIS — L400 Psoriasis vulgaris: Secondary | ICD-10-CM | POA: Diagnosis not present

## 2019-10-10 DIAGNOSIS — Z79899 Other long term (current) drug therapy: Secondary | ICD-10-CM | POA: Diagnosis not present

## 2019-10-23 ENCOUNTER — Other Ambulatory Visit: Payer: Self-pay | Admitting: Pharmacist

## 2019-10-23 ENCOUNTER — Encounter: Payer: Self-pay | Admitting: Pharmacist

## 2019-10-23 ENCOUNTER — Ambulatory Visit (HOSPITAL_BASED_OUTPATIENT_CLINIC_OR_DEPARTMENT_OTHER): Payer: Self-pay | Admitting: Pharmacist

## 2019-10-23 ENCOUNTER — Other Ambulatory Visit: Payer: Self-pay

## 2019-10-23 DIAGNOSIS — Z79899 Other long term (current) drug therapy: Secondary | ICD-10-CM

## 2019-10-23 MED ORDER — COSENTYX SENSOREADY (300 MG) 150 MG/ML ~~LOC~~ SOAJ: 300.0000 mg | SUBCUTANEOUS | 0 refills | Status: DC

## 2019-10-23 MED ORDER — COSENTYX SENSOREADY (300 MG) 150 MG/ML ~~LOC~~ SOAJ: 300.0000 mg | SUBCUTANEOUS | 11 refills | Status: DC

## 2019-10-23 MED FILL — COSENTYX 300 MG DOSE-2 PENS: 150 | 28 days supply | Qty: 8 | Fill #0

## 2019-10-23 NOTE — Progress Notes (Signed)
   S: Patient presents today for review of their specialty medication.   Patient is currently prescribed Cosentyx (secukinumab) for psoriasis. Patient is managed by Enid Derry for this.   Per pt, she had tried other injectables in the past such as Skyrizi and Humira.   Dosing: Plaque psoriasis: SubQ: 300 mg once weekly at weeks 0, 1, 2, 3, and 4 followed by 300 mg every 4 weeks. Some patients may only require 150 mg per dose.  Adherence: has not yet started  Efficacy: has not yet started  Current adverse effects: S/sx of infection: none; counseling provided GI upset:  none; counseling provided Headache: none; counseling provided S/sx of hypersensitivity: none; counseling provided  O: Monitored by Dr. Kellie Moor per patient. She has discussed this with them.   Lab Results  Component Value Date   WBC 7.0 11/21/2012   HGB 9.0 (L) 11/28/2012   HCT 38.3 11/21/2012   MCV 92 11/21/2012   PLT 237 11/21/2012     Chemistry      Component Value Date/Time   NA 136 11/28/2012 0443   K 3.7 11/28/2012 0443   CL 104 11/28/2012 0443   CO2 24 11/28/2012 0443   BUN 18 11/28/2012 0443   CREATININE 0.89 11/28/2012 0443      Component Value Date/Time   CALCIUM 8.3 (L) 11/28/2012 0443     A/P: 1. Medication review: Patient is currently prescribed Cosentyx for psoriasis but has not started it. She feels comfortable with the medication given previous experience with other injectables. Reviewed the medication with the patient, including the following: Cosentyx is a monoclonal antibody used in the treatment of ankylosing spondylitis, psoriasis, and psoriatic arthritis. The injection is subq and the medication should be allowed to reach room temp prior to injecting. Injection sites should be rotated. Possible adverse effects include headaches, GI upset, increased risk of infection and hypersensitivity reactions. No recommendations for any changes at this time.   Benard Halsted, PharmD,  [place] [phone]

## 2019-10-27 DIAGNOSIS — M5412 Radiculopathy, cervical region: Secondary | ICD-10-CM | POA: Diagnosis not present

## 2019-10-27 DIAGNOSIS — S14105A Unspecified injury at C5 level of cervical spinal cord, initial encounter: Secondary | ICD-10-CM | POA: Diagnosis not present

## 2019-10-27 DIAGNOSIS — M542 Cervicalgia: Secondary | ICD-10-CM | POA: Diagnosis not present

## 2019-10-27 DIAGNOSIS — R200 Anesthesia of skin: Secondary | ICD-10-CM | POA: Diagnosis not present

## 2019-11-07 ENCOUNTER — Other Ambulatory Visit: Payer: Self-pay | Admitting: Neurosurgery

## 2019-11-07 DIAGNOSIS — F411 Generalized anxiety disorder: Secondary | ICD-10-CM | POA: Diagnosis not present

## 2019-11-07 DIAGNOSIS — S14105A Unspecified injury at C5 level of cervical spinal cord, initial encounter: Secondary | ICD-10-CM

## 2019-11-07 DIAGNOSIS — I10 Essential (primary) hypertension: Secondary | ICD-10-CM | POA: Diagnosis not present

## 2019-12-24 ENCOUNTER — Encounter: Payer: 59 | Admitting: Neurology

## 2019-12-30 MED FILL — COSENTYX 300 MG DOSE-2 PENS: 150 | 28 days supply | Qty: 2 | Fill #0

## 2020-03-03 MED FILL — COSENTYX 300 MG DOSE-2 PENS: 150 | 28 days supply | Qty: 2 | Fill #1

## 2020-04-14 ENCOUNTER — Other Ambulatory Visit: Payer: Self-pay | Admitting: Dermatology

## 2020-04-14 DIAGNOSIS — R202 Paresthesia of skin: Secondary | ICD-10-CM | POA: Diagnosis not present

## 2020-04-14 DIAGNOSIS — L239 Allergic contact dermatitis, unspecified cause: Secondary | ICD-10-CM | POA: Diagnosis not present

## 2020-04-14 DIAGNOSIS — L400 Psoriasis vulgaris: Secondary | ICD-10-CM | POA: Diagnosis not present

## 2020-04-16 DIAGNOSIS — L400 Psoriasis vulgaris: Secondary | ICD-10-CM | POA: Diagnosis not present

## 2020-04-19 DIAGNOSIS — I10 Essential (primary) hypertension: Secondary | ICD-10-CM | POA: Diagnosis not present

## 2020-04-19 DIAGNOSIS — Z1239 Encounter for other screening for malignant neoplasm of breast: Secondary | ICD-10-CM | POA: Diagnosis not present

## 2020-04-19 DIAGNOSIS — R7303 Prediabetes: Secondary | ICD-10-CM | POA: Diagnosis not present

## 2020-04-19 DIAGNOSIS — E785 Hyperlipidemia, unspecified: Secondary | ICD-10-CM | POA: Diagnosis not present

## 2020-04-19 DIAGNOSIS — Z0000 Encounter for general adult medical examination without abnormal findings: Secondary | ICD-10-CM | POA: Diagnosis not present

## 2020-04-22 DIAGNOSIS — H906 Mixed conductive and sensorineural hearing loss, bilateral: Secondary | ICD-10-CM | POA: Diagnosis not present

## 2020-05-06 ENCOUNTER — Other Ambulatory Visit (HOSPITAL_COMMUNITY): Payer: Self-pay | Admitting: Internal Medicine

## 2020-05-06 ENCOUNTER — Other Ambulatory Visit: Payer: Self-pay | Admitting: Pharmacist

## 2020-05-06 MED ORDER — COSENTYX SENSOREADY (300 MG) 150 MG/ML ~~LOC~~ SOAJ: 300.0000 mg | SUBCUTANEOUS | 11 refills | Status: DC

## 2020-05-11 ENCOUNTER — Other Ambulatory Visit: Payer: Self-pay | Admitting: Otolaryngology

## 2020-05-11 DIAGNOSIS — H906 Mixed conductive and sensorineural hearing loss, bilateral: Secondary | ICD-10-CM | POA: Diagnosis not present

## 2020-05-14 ENCOUNTER — Ambulatory Visit
Admission: RE | Admit: 2020-05-14 | Discharge: 2020-05-14 | Disposition: A | Discharge status: Home / Self Care | Payer: 59 | Source: Ambulatory Visit | Attending: Otolaryngology | Admitting: Otolaryngology

## 2020-05-14 ENCOUNTER — Other Ambulatory Visit: Payer: Self-pay

## 2020-05-14 DIAGNOSIS — H906 Mixed conductive and sensorineural hearing loss, bilateral: Secondary | ICD-10-CM | POA: Insufficient documentation

## 2020-05-17 MED FILL — COSENTYX 300 MG DOSE-2 PENS: 150 | 28 days supply | Qty: 2 | Fill #0

## 2020-05-20 DIAGNOSIS — H8013 Otosclerosis involving oval window, obliterative, bilateral: Secondary | ICD-10-CM | POA: Diagnosis not present

## 2020-06-07 MED FILL — COSENTYX 300 MG DOSE-2 PENS: 150 | 28 days supply | Qty: 2 | Fill #2

## 2020-06-18 DIAGNOSIS — L400 Psoriasis vulgaris: Secondary | ICD-10-CM | POA: Diagnosis not present

## 2020-07-05 ENCOUNTER — Other Ambulatory Visit: Payer: Self-pay | Admitting: Physician Assistant

## 2020-07-05 MED FILL — COSENTYX 300 MG DOSE-2 PENS: 150 | 28 days supply | Qty: 2 | Fill #3

## 2020-07-09 DIAGNOSIS — H8093 Unspecified otosclerosis, bilateral: Secondary | ICD-10-CM | POA: Diagnosis not present

## 2020-08-02 MED FILL — COSENTYX 300 MG DOSE-2 PENS: 150 | 28 days supply | Qty: 2 | Fill #4

## 2020-08-17 ENCOUNTER — Other Ambulatory Visit: Payer: Self-pay | Admitting: Dermatology

## 2020-08-23 DIAGNOSIS — Z20822 Contact with and (suspected) exposure to covid-19: Secondary | ICD-10-CM | POA: Diagnosis not present

## 2020-08-26 DIAGNOSIS — H8093 Unspecified otosclerosis, bilateral: Secondary | ICD-10-CM | POA: Diagnosis not present

## 2020-08-26 DIAGNOSIS — I10 Essential (primary) hypertension: Secondary | ICD-10-CM | POA: Diagnosis not present

## 2020-08-26 DIAGNOSIS — H8092 Unspecified otosclerosis, left ear: Secondary | ICD-10-CM | POA: Diagnosis not present

## 2020-08-26 DIAGNOSIS — H906 Mixed conductive and sensorineural hearing loss, bilateral: Secondary | ICD-10-CM | POA: Diagnosis not present

## 2020-08-26 DIAGNOSIS — Z79899 Other long term (current) drug therapy: Secondary | ICD-10-CM | POA: Diagnosis not present

## 2020-08-26 DIAGNOSIS — E785 Hyperlipidemia, unspecified: Secondary | ICD-10-CM | POA: Diagnosis not present

## 2020-09-02 MED FILL — COSENTYX 300 MG DOSE-2 PENS: 150 | 28 days supply | Qty: 2 | Fill #5

## 2020-10-12 ENCOUNTER — Other Ambulatory Visit: Payer: Self-pay | Admitting: Physician Assistant

## 2020-10-13 DIAGNOSIS — Z1389 Encounter for screening for other disorder: Secondary | ICD-10-CM | POA: Diagnosis not present

## 2020-10-13 DIAGNOSIS — Z0000 Encounter for general adult medical examination without abnormal findings: Secondary | ICD-10-CM | POA: Diagnosis not present

## 2020-10-13 DIAGNOSIS — Z13220 Encounter for screening for lipoid disorders: Secondary | ICD-10-CM | POA: Diagnosis not present

## 2020-10-13 DIAGNOSIS — Z131 Encounter for screening for diabetes mellitus: Secondary | ICD-10-CM | POA: Diagnosis not present

## 2020-10-15 ENCOUNTER — Other Ambulatory Visit: Payer: Self-pay

## 2020-10-15 ENCOUNTER — Ambulatory Visit (HOSPITAL_BASED_OUTPATIENT_CLINIC_OR_DEPARTMENT_OTHER): Payer: 59 | Admitting: Pharmacist

## 2020-10-15 DIAGNOSIS — Z79899 Other long term (current) drug therapy: Secondary | ICD-10-CM

## 2020-10-15 NOTE — Progress Notes (Signed)
   S: Patient presents today for review of their specialty medication.   Patient is currently prescribed Cosentyx (secukinumab) for psoriasis. Patient is managed by Lester Kinsman for this.   Per pt, she had tried other injectables in the past such as Skyrizi and Humira.   Dosing: Plaque psoriasis: SubQ: 300 mg every 4 weeks.  Adherence: confirmed  Efficacy: reports that it seems to be working but she is unsure  Current adverse effects: S/sx of infection: none GI upset:  none Headache: none S/sx of hypersensitivity: none  O: Monitored by Dr. Roseanne Kaufman per patient.   Lab Results  Component Value Date   WBC 7.0 11/21/2012   HGB 9.0 (L) 11/28/2012   HCT 38.3 11/21/2012   MCV 92 11/21/2012   PLT 237 11/21/2012     Chemistry      Component Value Date/Time   NA 136 11/28/2012 0443   K 3.7 11/28/2012 0443   CL 104 11/28/2012 0443   CO2 24 11/28/2012 0443   BUN 18 11/28/2012 0443   CREATININE 0.89 11/28/2012 0443      Component Value Date/Time   CALCIUM 8.3 (L) 11/28/2012 0443     A/P: 1. Medication review: Patient is currently on Cosentyx for psoriasis. She feels comfortable with the medication given previous experience with other injectables. Reviewed the medication with the patient, including the following: Cosentyx is a monoclonal antibody used in the treatment of ankylosing spondylitis, psoriasis, and psoriatic arthritis. The injection is subq and the medication should be allowed to reach room temp prior to injecting. Injection sites should be rotated. Possible adverse effects include headaches, GI upset, increased risk of infection and hypersensitivity reactions. Recommend that she discuss efficacy with her specialist at her follow-up visit.   Butch Penny, PharmD, CPP Clinical Pharmacist [facility] [phone]

## 2020-10-20 ENCOUNTER — Other Ambulatory Visit: Payer: Self-pay | Admitting: Physician Assistant

## 2020-10-20 DIAGNOSIS — I10 Essential (primary) hypertension: Secondary | ICD-10-CM | POA: Diagnosis not present

## 2020-10-20 DIAGNOSIS — R7303 Prediabetes: Secondary | ICD-10-CM | POA: Diagnosis not present

## 2020-10-20 DIAGNOSIS — E669 Obesity, unspecified: Secondary | ICD-10-CM | POA: Diagnosis not present

## 2020-10-20 DIAGNOSIS — E785 Hyperlipidemia, unspecified: Secondary | ICD-10-CM | POA: Diagnosis not present

## 2020-11-02 MED FILL — COSENTYX 300 MG DOSE-2 PENS: 150 | 28 days supply | Qty: 2 | Fill #1

## 2020-11-11 ENCOUNTER — Other Ambulatory Visit: Payer: Self-pay | Admitting: Dermatology

## 2020-11-17 DIAGNOSIS — E785 Hyperlipidemia, unspecified: Secondary | ICD-10-CM | POA: Diagnosis not present

## 2020-11-17 DIAGNOSIS — E669 Obesity, unspecified: Secondary | ICD-10-CM | POA: Diagnosis not present

## 2020-11-17 DIAGNOSIS — I10 Essential (primary) hypertension: Secondary | ICD-10-CM | POA: Diagnosis not present

## 2020-11-17 DIAGNOSIS — L0231 Cutaneous abscess of buttock: Secondary | ICD-10-CM | POA: Diagnosis not present

## 2020-11-17 DIAGNOSIS — R7303 Prediabetes: Secondary | ICD-10-CM | POA: Diagnosis not present

## 2020-12-02 DIAGNOSIS — H524 Presbyopia: Secondary | ICD-10-CM | POA: Diagnosis not present

## 2020-12-06 MED FILL — COSENTYX 300 MG DOSE-2 PENS: 150 | 28 days supply | Qty: 2 | Fill #2

## 2021-01-05 MED FILL — COSENTYX 300 MG DOSE-2 PENS: 150 | 28 days supply | Qty: 2 | Fill #3

## 2021-01-06 ENCOUNTER — Other Ambulatory Visit: Payer: Self-pay | Admitting: Dermatology

## 2021-01-06 DIAGNOSIS — L400 Psoriasis vulgaris: Secondary | ICD-10-CM | POA: Diagnosis not present

## 2021-01-06 DIAGNOSIS — Z79899 Other long term (current) drug therapy: Secondary | ICD-10-CM | POA: Diagnosis not present

## 2021-01-31 MED FILL — COSENTYX 300 MG DOSE-2 PENS: 150 | 28 days supply | Qty: 2 | Fill #4

## 2021-03-01 ENCOUNTER — Other Ambulatory Visit (HOSPITAL_COMMUNITY): Payer: Self-pay

## 2021-03-01 MED FILL — COSENTYX 300 MG DOSE-2 PENS: 150 | 28 days supply | Qty: 2 | Fill #5

## 2021-03-08 ENCOUNTER — Other Ambulatory Visit: Payer: Self-pay

## 2021-03-08 MED FILL — Ketoconazole Shampoo 2%: CUTANEOUS | 20 days supply | Qty: 120 | Fill #0 | Status: AC

## 2021-03-08 MED FILL — Gabapentin Cap 300 MG: ORAL | 30 days supply | Qty: 30 | Fill #0 | Status: AC

## 2021-03-08 MED FILL — Methotrexate Sodium Tab 2.5 MG (Base Equiv): ORAL | 30 days supply | Qty: 40 | Fill #0 | Status: AC

## 2021-03-08 MED FILL — Fluocinonide Soln 0.05%: CUTANEOUS | 20 days supply | Qty: 60 | Fill #0 | Status: AC

## 2021-03-29 ENCOUNTER — Other Ambulatory Visit (HOSPITAL_COMMUNITY): Payer: Self-pay

## 2021-03-29 MED FILL — Secukinumab Subcutaneous Auto-inj 150 MG/ML (300 MG Dose): SUBCUTANEOUS | 28 days supply | Qty: 2 | Fill #0 | Status: AC

## 2021-03-31 ENCOUNTER — Other Ambulatory Visit (HOSPITAL_COMMUNITY): Payer: Self-pay

## 2021-04-06 ENCOUNTER — Other Ambulatory Visit: Payer: Self-pay

## 2021-04-06 MED ORDER — FOLIC ACID 1 MG PO TABS
ORAL_TABLET | ORAL | 1 refills | Status: DC
  Filled 2021-04-06: qty 90, 90d supply, fill #0

## 2021-04-06 MED FILL — Atorvastatin Calcium Tab 20 MG (Base Equivalent): ORAL | 90 days supply | Qty: 90 | Fill #0 | Status: AC

## 2021-04-07 ENCOUNTER — Other Ambulatory Visit: Payer: Self-pay

## 2021-04-07 MED ORDER — BETAMETHASONE DIPROPIONATE AUG 0.05 % EX CREA
TOPICAL_CREAM | CUTANEOUS | 0 refills | Status: DC
Start: 2020-04-14 — End: 2021-09-26
  Filled 2021-04-07: qty 50, 30d supply, fill #0

## 2021-04-07 MED FILL — Gabapentin Cap 300 MG: ORAL | 30 days supply | Qty: 30 | Fill #1 | Status: AC

## 2021-04-08 ENCOUNTER — Other Ambulatory Visit: Payer: Self-pay

## 2021-04-25 ENCOUNTER — Other Ambulatory Visit (HOSPITAL_COMMUNITY): Payer: Self-pay

## 2021-05-03 ENCOUNTER — Other Ambulatory Visit (HOSPITAL_COMMUNITY): Payer: Self-pay

## 2021-05-03 MED FILL — Gabapentin Cap 300 MG: ORAL | 30 days supply | Qty: 30 | Fill #2 | Status: AC

## 2021-05-03 MED FILL — Escitalopram Oxalate Tab 10 MG (Base Equiv): ORAL | 90 days supply | Qty: 90 | Fill #0 | Status: AC

## 2021-05-03 MED FILL — Secukinumab Subcutaneous Auto-inj 150 MG/ML (300 MG Dose): SUBCUTANEOUS | 28 days supply | Qty: 2 | Fill #1 | Status: AC

## 2021-05-04 ENCOUNTER — Other Ambulatory Visit: Payer: Self-pay

## 2021-05-06 DIAGNOSIS — H90A31 Mixed conductive and sensorineural hearing loss, unilateral, right ear with restricted hearing on the contralateral side: Secondary | ICD-10-CM | POA: Diagnosis not present

## 2021-05-06 DIAGNOSIS — H8093 Unspecified otosclerosis, bilateral: Secondary | ICD-10-CM | POA: Diagnosis not present

## 2021-05-06 DIAGNOSIS — H90A22 Sensorineural hearing loss, unilateral, left ear, with restricted hearing on the contralateral side: Secondary | ICD-10-CM | POA: Diagnosis not present

## 2021-05-26 ENCOUNTER — Other Ambulatory Visit (HOSPITAL_COMMUNITY): Payer: Self-pay

## 2021-05-26 MED ORDER — SECUKINUMAB (300 MG DOSE) 150 MG/ML ~~LOC~~ SOAJ
300.0000 mg | SUBCUTANEOUS | 11 refills | Status: DC
  Filled 2021-05-26: qty 2, 28d supply, fill #0

## 2021-05-27 ENCOUNTER — Other Ambulatory Visit (HOSPITAL_COMMUNITY): Payer: Self-pay

## 2021-05-27 ENCOUNTER — Other Ambulatory Visit: Payer: Self-pay | Admitting: Pharmacist

## 2021-05-27 MED ORDER — COSENTYX SENSOREADY (300 MG) 150 MG/ML ~~LOC~~ SOAJ
300.0000 mg | SUBCUTANEOUS | 11 refills | Status: AC
  Filled 2021-05-27 – 2021-06-28 (×5): qty 2, 28d supply, fill #0
  Filled 2021-07-25: qty 2, 28d supply, fill #1
  Filled 2021-08-26: qty 2, 28d supply, fill #2
  Filled 2021-09-26: qty 2, 28d supply, fill #3
  Filled 2021-10-28: qty 2, 28d supply, fill #4
  Filled 2021-11-24: qty 2, 28d supply, fill #5
  Filled 2022-01-09: qty 2, 28d supply, fill #6
  Filled 2022-03-06: qty 2, 28d supply, fill #7
  Filled 2022-03-27: qty 2, 28d supply, fill #8
  Filled 2022-04-26: qty 2, 28d supply, fill #9
  Filled 2022-05-19: qty 2, 28d supply, fill #10

## 2021-05-31 ENCOUNTER — Other Ambulatory Visit (HOSPITAL_COMMUNITY): Payer: Self-pay

## 2021-06-07 ENCOUNTER — Other Ambulatory Visit (HOSPITAL_COMMUNITY): Payer: Self-pay

## 2021-06-08 ENCOUNTER — Other Ambulatory Visit (HOSPITAL_COMMUNITY): Payer: Self-pay

## 2021-06-14 ENCOUNTER — Other Ambulatory Visit (HOSPITAL_COMMUNITY): Payer: Self-pay

## 2021-06-15 ENCOUNTER — Other Ambulatory Visit: Payer: Self-pay

## 2021-06-15 DIAGNOSIS — M7581 Other shoulder lesions, right shoulder: Secondary | ICD-10-CM | POA: Diagnosis not present

## 2021-06-15 DIAGNOSIS — G8929 Other chronic pain: Secondary | ICD-10-CM | POA: Diagnosis not present

## 2021-06-15 DIAGNOSIS — G5601 Carpal tunnel syndrome, right upper limb: Secondary | ICD-10-CM | POA: Diagnosis not present

## 2021-06-15 DIAGNOSIS — M25511 Pain in right shoulder: Secondary | ICD-10-CM | POA: Diagnosis not present

## 2021-06-15 DIAGNOSIS — Z9889 Other specified postprocedural states: Secondary | ICD-10-CM | POA: Diagnosis not present

## 2021-06-15 MED ORDER — MELOXICAM 7.5 MG PO TABS
ORAL_TABLET | ORAL | 0 refills | Status: DC
  Filled 2021-06-15: qty 30, 30d supply, fill #0

## 2021-06-15 MED FILL — Gabapentin Cap 300 MG: ORAL | 30 days supply | Qty: 30 | Fill #3 | Status: AC

## 2021-06-16 ENCOUNTER — Other Ambulatory Visit: Payer: Self-pay

## 2021-06-16 MED FILL — Ketoconazole Shampoo 2%: CUTANEOUS | 20 days supply | Qty: 120 | Fill #1 | Status: AC

## 2021-06-21 ENCOUNTER — Other Ambulatory Visit (HOSPITAL_COMMUNITY): Payer: Self-pay

## 2021-06-28 ENCOUNTER — Other Ambulatory Visit (HOSPITAL_COMMUNITY): Payer: Self-pay

## 2021-07-07 DIAGNOSIS — F32A Depression, unspecified: Secondary | ICD-10-CM | POA: Diagnosis not present

## 2021-07-07 DIAGNOSIS — H8091 Unspecified otosclerosis, right ear: Secondary | ICD-10-CM | POA: Diagnosis not present

## 2021-07-07 DIAGNOSIS — Z974 Presence of external hearing-aid: Secondary | ICD-10-CM | POA: Diagnosis not present

## 2021-07-07 DIAGNOSIS — Z79899 Other long term (current) drug therapy: Secondary | ICD-10-CM | POA: Diagnosis not present

## 2021-07-07 DIAGNOSIS — H906 Mixed conductive and sensorineural hearing loss, bilateral: Secondary | ICD-10-CM | POA: Diagnosis not present

## 2021-07-07 DIAGNOSIS — E785 Hyperlipidemia, unspecified: Secondary | ICD-10-CM | POA: Diagnosis not present

## 2021-07-07 DIAGNOSIS — I10 Essential (primary) hypertension: Secondary | ICD-10-CM | POA: Diagnosis not present

## 2021-07-13 ENCOUNTER — Other Ambulatory Visit: Payer: Self-pay

## 2021-07-13 DIAGNOSIS — G5601 Carpal tunnel syndrome, right upper limb: Secondary | ICD-10-CM | POA: Diagnosis not present

## 2021-07-13 MED ORDER — HYDROCODONE-ACETAMINOPHEN 5-325 MG PO TABS
1.0000 | ORAL_TABLET | Freq: Four times a day (QID) | ORAL | 0 refills | Status: DC | PRN
  Filled 2021-07-13: qty 15, 4d supply, fill #0

## 2021-07-13 MED FILL — Lisinopril & Hydrochlorothiazide Tab 20-12.5 MG: ORAL | 90 days supply | Qty: 180 | Fill #0 | Status: AC

## 2021-07-13 MED FILL — Gabapentin Cap 300 MG: ORAL | 30 days supply | Qty: 30 | Fill #4 | Status: AC

## 2021-07-25 ENCOUNTER — Other Ambulatory Visit (HOSPITAL_COMMUNITY): Payer: Self-pay

## 2021-07-27 DIAGNOSIS — M7581 Other shoulder lesions, right shoulder: Secondary | ICD-10-CM | POA: Diagnosis not present

## 2021-07-28 ENCOUNTER — Other Ambulatory Visit: Payer: Self-pay | Admitting: Student

## 2021-07-28 DIAGNOSIS — M7581 Other shoulder lesions, right shoulder: Secondary | ICD-10-CM

## 2021-07-28 DIAGNOSIS — Z9889 Other specified postprocedural states: Secondary | ICD-10-CM

## 2021-08-02 ENCOUNTER — Other Ambulatory Visit (HOSPITAL_COMMUNITY): Payer: Self-pay

## 2021-08-15 ENCOUNTER — Other Ambulatory Visit: Payer: Self-pay

## 2021-08-15 DIAGNOSIS — E785 Hyperlipidemia, unspecified: Secondary | ICD-10-CM | POA: Diagnosis not present

## 2021-08-15 DIAGNOSIS — Z0000 Encounter for general adult medical examination without abnormal findings: Secondary | ICD-10-CM | POA: Diagnosis not present

## 2021-08-15 DIAGNOSIS — E669 Obesity, unspecified: Secondary | ICD-10-CM | POA: Diagnosis not present

## 2021-08-15 DIAGNOSIS — R8290 Unspecified abnormal findings in urine: Secondary | ICD-10-CM | POA: Diagnosis not present

## 2021-08-15 DIAGNOSIS — Z23 Encounter for immunization: Secondary | ICD-10-CM | POA: Diagnosis not present

## 2021-08-15 DIAGNOSIS — I10 Essential (primary) hypertension: Secondary | ICD-10-CM | POA: Diagnosis not present

## 2021-08-15 DIAGNOSIS — R7303 Prediabetes: Secondary | ICD-10-CM | POA: Diagnosis not present

## 2021-08-15 MED ORDER — ESCITALOPRAM OXALATE 10 MG PO TABS
ORAL_TABLET | ORAL | 2 refills | Status: DC
  Filled 2021-08-15: qty 90, 90d supply, fill #0
  Filled 2021-11-08: qty 90, 90d supply, fill #1
  Filled 2022-02-14: qty 90, 90d supply, fill #2

## 2021-08-15 MED ORDER — TRAZODONE HCL 50 MG PO TABS
50.0000 mg | ORAL_TABLET | Freq: Every day | ORAL | 5 refills | Status: DC
  Filled 2021-08-15: qty 30, 30d supply, fill #0
  Filled 2021-08-24 – 2021-09-11 (×2): qty 30, 30d supply, fill #1
  Filled 2021-10-09: qty 30, 30d supply, fill #2
  Filled 2021-11-08: qty 30, 30d supply, fill #3
  Filled 2021-12-12: qty 30, 30d supply, fill #4
  Filled 2022-01-17: qty 30, 30d supply, fill #5

## 2021-08-16 ENCOUNTER — Ambulatory Visit
Admission: RE | Admit: 2021-08-16 | Discharge: 2021-08-16 | Disposition: A | Discharge status: Home / Self Care | Payer: 59 | Source: Ambulatory Visit | Attending: Student | Admitting: Student

## 2021-08-16 ENCOUNTER — Other Ambulatory Visit: Payer: Self-pay

## 2021-08-16 ENCOUNTER — Ambulatory Visit: Admission: RE | Admit: 2021-08-16 | Payer: 59 | Source: Ambulatory Visit

## 2021-08-16 DIAGNOSIS — M7581 Other shoulder lesions, right shoulder: Secondary | ICD-10-CM

## 2021-08-16 DIAGNOSIS — Z9889 Other specified postprocedural states: Secondary | ICD-10-CM | POA: Insufficient documentation

## 2021-08-16 DIAGNOSIS — S46011A Strain of muscle(s) and tendon(s) of the rotator cuff of right shoulder, initial encounter: Secondary | ICD-10-CM | POA: Diagnosis not present

## 2021-08-16 DIAGNOSIS — M25511 Pain in right shoulder: Secondary | ICD-10-CM | POA: Diagnosis not present

## 2021-08-16 HISTORY — PX: IR FLUORO GUIDED NEEDLE PLC ASPIRATION/INJECTION LOC: IMG2395

## 2021-08-16 MED ORDER — IOHEXOL 180 MG/ML  SOLN
13.0000 mL | Freq: Once | INTRAMUSCULAR | Status: AC | PRN
  Administered 2021-08-16: 13 mL via INTRA_ARTICULAR
  Filled 2021-08-16: qty 15

## 2021-08-16 MED ORDER — NITROFURANTOIN MONOHYD MACRO 100 MG PO CAPS
100.0000 mg | ORAL_CAPSULE | Freq: Two times a day (BID) | ORAL | 0 refills | Status: DC
  Filled 2021-08-16: qty 14, 7d supply, fill #0

## 2021-08-16 MED ORDER — LIDOCAINE 1% INJECTION FOR CIRCUMCISION
INJECTION | INTRAVENOUS | Status: DC | PRN
  Administered 2021-08-16: 5 mL via SUBCUTANEOUS

## 2021-08-16 MED ORDER — SODIUM CHLORIDE 0.9% FLUSH
INTRAVENOUS | Status: DC | PRN
  Administered 2021-08-16: 7 mL

## 2021-08-16 NOTE — Procedures (Signed)
Successful fluoroscopic guided right shoulder joint injection of contrast for CT. No complications. See PACS for full report.  Berneta Levins, PA-C 08/16/2021, 12:26 PM

## 2021-08-17 ENCOUNTER — Other Ambulatory Visit: Payer: Self-pay

## 2021-08-17 MED FILL — Ketoconazole Shampoo 2%: CUTANEOUS | 20 days supply | Qty: 120 | Fill #2 | Status: AC

## 2021-08-18 ENCOUNTER — Other Ambulatory Visit: Payer: Self-pay

## 2021-08-18 MED ORDER — GABAPENTIN 300 MG PO CAPS
ORAL_CAPSULE | Freq: Every day | ORAL | 6 refills | Status: AC
  Filled 2021-08-18: qty 30, 30d supply, fill #0
  Filled 2021-09-22: qty 30, 30d supply, fill #1
  Filled 2021-10-24: qty 30, 30d supply, fill #2
  Filled 2021-11-23: qty 30, 30d supply, fill #3
  Filled 2022-01-17: qty 30, 30d supply, fill #4
  Filled 2022-02-14: qty 30, 30d supply, fill #5
  Filled 2022-03-21: qty 30, 30d supply, fill #6

## 2021-08-19 ENCOUNTER — Other Ambulatory Visit: Payer: Self-pay

## 2021-08-23 DIAGNOSIS — H90A31 Mixed conductive and sensorineural hearing loss, unilateral, right ear with restricted hearing on the contralateral side: Secondary | ICD-10-CM | POA: Diagnosis not present

## 2021-08-24 ENCOUNTER — Other Ambulatory Visit: Payer: Self-pay

## 2021-08-25 ENCOUNTER — Other Ambulatory Visit: Payer: Self-pay

## 2021-08-26 ENCOUNTER — Other Ambulatory Visit (HOSPITAL_COMMUNITY): Payer: Self-pay

## 2021-08-31 ENCOUNTER — Other Ambulatory Visit (HOSPITAL_COMMUNITY): Payer: Self-pay

## 2021-09-11 ENCOUNTER — Other Ambulatory Visit: Payer: Self-pay

## 2021-09-12 ENCOUNTER — Other Ambulatory Visit: Payer: Self-pay

## 2021-09-12 MED ORDER — ATORVASTATIN CALCIUM 20 MG PO TABS
20.0000 mg | ORAL_TABLET | Freq: Every day | ORAL | 1 refills | Status: DC
  Filled 2021-09-12: qty 90, 90d supply, fill #0

## 2021-09-16 DIAGNOSIS — M19011 Primary osteoarthritis, right shoulder: Secondary | ICD-10-CM | POA: Diagnosis not present

## 2021-09-16 DIAGNOSIS — M7581 Other shoulder lesions, right shoulder: Secondary | ICD-10-CM | POA: Diagnosis not present

## 2021-09-16 DIAGNOSIS — Z9889 Other specified postprocedural states: Secondary | ICD-10-CM | POA: Diagnosis not present

## 2021-09-22 ENCOUNTER — Other Ambulatory Visit: Payer: Self-pay | Admitting: Surgery

## 2021-09-22 ENCOUNTER — Other Ambulatory Visit: Payer: Self-pay

## 2021-09-22 MED FILL — Ketoconazole Shampoo 2%: CUTANEOUS | 20 days supply | Qty: 120 | Fill #3 | Status: AC

## 2021-09-23 ENCOUNTER — Other Ambulatory Visit: Payer: Self-pay

## 2021-09-26 ENCOUNTER — Other Ambulatory Visit (HOSPITAL_COMMUNITY): Payer: Self-pay

## 2021-09-26 ENCOUNTER — Other Ambulatory Visit: Payer: Self-pay

## 2021-09-26 MED ORDER — BETAMETHASONE DIPROPIONATE AUG 0.05 % EX CREA
TOPICAL_CREAM | CUTANEOUS | 0 refills | Status: DC
  Filled 2021-09-26: qty 50, 20d supply, fill #0

## 2021-10-03 ENCOUNTER — Other Ambulatory Visit (HOSPITAL_COMMUNITY): Payer: Self-pay

## 2021-10-03 ENCOUNTER — Encounter
Admission: RE | Admit: 2021-10-03 | Discharge: 2021-10-03 | Disposition: A | Discharge status: Home / Self Care | Payer: 59 | Source: Ambulatory Visit | Attending: Surgery | Admitting: Surgery

## 2021-10-03 ENCOUNTER — Other Ambulatory Visit: Payer: Self-pay

## 2021-10-03 HISTORY — DX: Psoriasis, unspecified: L40.9

## 2021-10-03 HISTORY — DX: Nausea with vomiting, unspecified: R11.2

## 2021-10-03 HISTORY — DX: Essential (primary) hypertension: I10

## 2021-10-03 HISTORY — DX: Other specified postprocedural states: Z98.890

## 2021-10-03 HISTORY — DX: Unspecified osteoarthritis, unspecified site: M19.90

## 2021-10-03 HISTORY — DX: Hyperlipidemia, unspecified: E78.5

## 2021-10-03 NOTE — Patient Instructions (Addendum)
Your procedure is scheduled on: 10/11/21 Report to DAY SURGERY DEPARTMENT LOCATED ON 2ND FLOOR MEDICAL MALL ENTRANCE. To find out your arrival time please call [phone] between 1PM - 3PM on 10/10/21.  Remember: Instructions that are not followed completely may result in serious medical risk, up to and including death, or upon the discretion of your surgeon and anesthesiologist your surgery may need to be rescheduled.     _X__ 1. Do not eat food after midnight the night before your procedure.                 No gum chewing or hard candies. You may drink clear liquids up to 2 hours                 before you are scheduled to arrive for your surgery- DO not drink clear                 liquids within 2 hours of the start of your surgery.                 Clear Liquids include:  water, apple juice without pulp, clear carbohydrate                 drink such as Clearfast or Gatorade, Black Coffee or Tea (Do not add                 anything to coffee or tea). Diabetics water only  DRINK THE ENSURE "CLEAR" PRE SURGERY DRINK 2 HOURS BEFORE ARRIVING TO SURGERY  __X__2.  On the morning of surgery brush your teeth with toothpaste and water, you                 may rinse your mouth with mouthwash if you wish.  Do not swallow any              toothpaste of mouthwash.     _X__ 3.  No Alcohol for 24 hours before or after surgery.   _X__ 4.  Do Not Smoke or use e-cigarettes For 24 Hours Prior to Your Surgery.                 Do not use any chewable tobacco products for at least 6 hours prior to                 surgery.  ____  5.  Bring all medications with you on the day of surgery if instructed.   __X__  6.  Notify your doctor if there is any change in your medical condition      (cold, fever, infections).     Do not wear jewelry, make-up, hairpins, clips or nail polish. Do not wear lotions, powders, or perfumes. NO DEODORANT Do not shave body hair 48 hours prior to surgery. Men may shave face and  neck. Do not bring valuables to the hospital.    [facility] is not responsible for any belongings or valuables.  Contacts, dentures/partials or body piercings may not be worn into surgery. Bring a case for your contacts, glasses or hearing aids, a denture cup will be supplied. Leave your suitcase in the car. After surgery it may be brought to your room. For patients admitted to the hospital, discharge time is determined by your treatment team.   Patients discharged the day of surgery will not be allowed to drive home.   Please read over the following fact sheets that you were given:   CHG  SOAP, INCENTIVE SPIROMETER, ENSURE DRINK  __X__ Take these medicines the morning of surgery with A SIP OF WATER:    1. atorvastatin (LIPITOR) 20 MG tablet  2. escitalopram (LEXAPRO) 10 MG tablet  3.   4.  5.  6.  ____ Fleet Enema (as directed)   __X__ Use CHG Soap/SAGE wipes as directed    ____ Use inhalers on the day of surgery  ____ Stop metformin/Janumet/Farxiga 2 days prior to surgery    ____ Take 1/2 of usual insulin dose the night before surgery. No insulin the morning          of surgery.   ____ Stop Blood Thinners Coumadin/Plavix/Xarelto/Pleta/Pradaxa/Eliquis/Effient/Aspirin  on   Or contact your Surgeon, Cardiologist or Medical Doctor regarding  ability to stop your blood thinners  __X__ Stop Anti-inflammatories 7 days before surgery such as Advil, Ibuprofen, Motrin,  BC or Goodies Powder, Naprosyn, Naproxen, Aleve, Aspirin   Meloxicam  __X__ Stop all herbals and supplements, fish oil or vitamins until after surgery.    ____ Bring C-Pap to the hospital.

## 2021-10-04 ENCOUNTER — Other Ambulatory Visit
Admission: RE | Admit: 2021-10-04 | Discharge: 2021-10-04 | Disposition: A | Discharge status: Home / Self Care | Payer: 59 | Source: Ambulatory Visit | Attending: Surgery | Admitting: Surgery

## 2021-10-04 DIAGNOSIS — E785 Hyperlipidemia, unspecified: Secondary | ICD-10-CM | POA: Diagnosis not present

## 2021-10-04 DIAGNOSIS — I10 Essential (primary) hypertension: Secondary | ICD-10-CM | POA: Insufficient documentation

## 2021-10-04 DIAGNOSIS — Z01810 Encounter for preprocedural cardiovascular examination: Secondary | ICD-10-CM | POA: Insufficient documentation

## 2021-10-10 ENCOUNTER — Other Ambulatory Visit: Payer: Self-pay

## 2021-10-11 ENCOUNTER — Encounter
Admission: RE | Disposition: A | Discharge status: Home / Self Care | Payer: Self-pay | Source: Home / Self Care | Attending: Surgery

## 2021-10-11 ENCOUNTER — Other Ambulatory Visit: Payer: Self-pay

## 2021-10-11 ENCOUNTER — Ambulatory Visit: Payer: 59 | Admitting: Certified Registered"

## 2021-10-11 ENCOUNTER — Ambulatory Visit: Payer: 59

## 2021-10-11 ENCOUNTER — Encounter: Payer: Self-pay | Admitting: Surgery

## 2021-10-11 ENCOUNTER — Ambulatory Visit
Admission: RE | Admit: 2021-10-11 | Discharge: 2021-10-11 | Disposition: A | Discharge status: Home / Self Care | Payer: 59 | Attending: Surgery | Admitting: Surgery

## 2021-10-11 DIAGNOSIS — M19011 Primary osteoarthritis, right shoulder: Secondary | ICD-10-CM | POA: Diagnosis not present

## 2021-10-11 DIAGNOSIS — M25811 Other specified joint disorders, right shoulder: Secondary | ICD-10-CM | POA: Diagnosis not present

## 2021-10-11 DIAGNOSIS — M25511 Pain in right shoulder: Secondary | ICD-10-CM

## 2021-10-11 DIAGNOSIS — I10 Essential (primary) hypertension: Secondary | ICD-10-CM | POA: Insufficient documentation

## 2021-10-11 DIAGNOSIS — M75121 Complete rotator cuff tear or rupture of right shoulder, not specified as traumatic: Secondary | ICD-10-CM | POA: Diagnosis not present

## 2021-10-11 DIAGNOSIS — L409 Psoriasis, unspecified: Secondary | ICD-10-CM | POA: Diagnosis not present

## 2021-10-11 DIAGNOSIS — M7541 Impingement syndrome of right shoulder: Secondary | ICD-10-CM | POA: Diagnosis not present

## 2021-10-11 DIAGNOSIS — Z79899 Other long term (current) drug therapy: Secondary | ICD-10-CM | POA: Diagnosis not present

## 2021-10-11 DIAGNOSIS — M7521 Bicipital tendinitis, right shoulder: Secondary | ICD-10-CM | POA: Diagnosis not present

## 2021-10-11 DIAGNOSIS — M1990 Unspecified osteoarthritis, unspecified site: Secondary | ICD-10-CM | POA: Insufficient documentation

## 2021-10-11 DIAGNOSIS — M7581 Other shoulder lesions, right shoulder: Secondary | ICD-10-CM | POA: Diagnosis not present

## 2021-10-11 DIAGNOSIS — E785 Hyperlipidemia, unspecified: Secondary | ICD-10-CM | POA: Diagnosis not present

## 2021-10-11 DIAGNOSIS — M24111 Other articular cartilage disorders, right shoulder: Secondary | ICD-10-CM | POA: Diagnosis not present

## 2021-10-11 DIAGNOSIS — M75111 Incomplete rotator cuff tear or rupture of right shoulder, not specified as traumatic: Secondary | ICD-10-CM | POA: Diagnosis not present

## 2021-10-11 HISTORY — PX: SHOULDER ARTHROSCOPY WITH SUBACROMIAL DECOMPRESSION, ROTATOR CUFF REPAIR AND BICEP TENDON REPAIR: SHX5687

## 2021-10-11 SURGERY — SHOULDER ARTHROSCOPY WITH SUBACROMIAL DECOMPRESSION, ROTATOR CUFF REPAIR AND BICEP TENDON REPAIR
Anesthesia: General | Site: Shoulder | Laterality: Right

## 2021-10-11 MED ORDER — ORAL CARE MOUTH RINSE: 15.0000 mL | Freq: Once | OROMUCOSAL | Status: AC

## 2021-10-11 MED ORDER — SUGAMMADEX SODIUM 200 MG/2ML IV SOLN
INTRAVENOUS | Status: DC | PRN
  Administered 2021-10-11 (×2): 200 mg via INTRAVENOUS

## 2021-10-11 MED ORDER — LIDOCAINE HCL (PF) 1 % IJ SOLN
INTRAMUSCULAR | Status: AC
  Filled 2021-10-11: qty 5

## 2021-10-11 MED ORDER — BUPIVACAINE HCL (PF) 0.5 % IJ SOLN
INTRAMUSCULAR | Status: DC | PRN
  Administered 2021-10-11: 10 mL via PERINEURAL

## 2021-10-11 MED ORDER — MIDAZOLAM HCL 2 MG/2ML IJ SOLN
INTRAMUSCULAR | Status: AC
  Administered 2021-10-11: 1 mg via INTRAVENOUS
  Filled 2021-10-11: qty 2

## 2021-10-11 MED ORDER — FAMOTIDINE 20 MG PO TABS
ORAL_TABLET | ORAL | Status: AC
  Administered 2021-10-11: 20 mg via ORAL
  Filled 2021-10-11: qty 1

## 2021-10-11 MED ORDER — KETOROLAC TROMETHAMINE 30 MG/ML IJ SOLN
30.0000 mg | Freq: Once | INTRAMUSCULAR | Status: AC
  Administered 2021-10-11: 30 mg via INTRAVENOUS

## 2021-10-11 MED ORDER — BUPIVACAINE HCL (PF) 0.5 % IJ SOLN
INTRAMUSCULAR | Status: AC
  Filled 2021-10-11: qty 10

## 2021-10-11 MED ORDER — EPINEPHRINE PF 1 MG/ML IJ SOLN
INTRAMUSCULAR | Status: AC
  Filled 2021-10-11: qty 2

## 2021-10-11 MED ORDER — LIDOCAINE HCL (CARDIAC) PF 100 MG/5ML IV SOSY
PREFILLED_SYRINGE | INTRAVENOUS | Status: DC | PRN
  Administered 2021-10-11: 60 mg via INTRAVENOUS

## 2021-10-11 MED ORDER — BUPIVACAINE LIPOSOME 1.3 % IJ SUSP
INTRAMUSCULAR | Status: DC | PRN
  Administered 2021-10-11: 20 mL via PERINEURAL

## 2021-10-11 MED ORDER — DEXAMETHASONE SODIUM PHOSPHATE 10 MG/ML IJ SOLN
INTRAMUSCULAR | Status: AC
  Filled 2021-10-11: qty 1

## 2021-10-11 MED ORDER — OXYCODONE HCL 5 MG PO TABS
5.0000 mg | ORAL_TABLET | ORAL | 0 refills | Status: DC
  Filled 2021-10-11: qty 40, 4d supply, fill #0

## 2021-10-11 MED ORDER — DEXMEDETOMIDINE (PRECEDEX) IN NS 20 MCG/5ML (4 MCG/ML) IV SYRINGE
PREFILLED_SYRINGE | INTRAVENOUS | Status: DC | PRN
  Administered 2021-10-11: 8 ug via INTRAVENOUS

## 2021-10-11 MED ORDER — FENTANYL CITRATE PF 50 MCG/ML IJ SOSY: 50.0000 ug | PREFILLED_SYRINGE | Freq: Once | INTRAMUSCULAR | Status: AC

## 2021-10-11 MED ORDER — PHENYLEPHRINE HCL (PRESSORS) 10 MG/ML IV SOLN
INTRAVENOUS | Status: DC | PRN
  Administered 2021-10-11: 60 ug via INTRAVENOUS
  Administered 2021-10-11: 100 ug via INTRAVENOUS
  Administered 2021-10-11: 80 ug via INTRAVENOUS
  Administered 2021-10-11: 30 ug via INTRAVENOUS

## 2021-10-11 MED ORDER — PROPOFOL 10 MG/ML IV BOLUS
INTRAVENOUS | Status: AC
  Filled 2021-10-11: qty 20

## 2021-10-11 MED ORDER — ROCURONIUM BROMIDE 100 MG/10ML IV SOLN
INTRAVENOUS | Status: DC | PRN
  Administered 2021-10-11: 20 mg via INTRAVENOUS
  Administered 2021-10-11: 50 mg via INTRAVENOUS
  Administered 2021-10-11: 10 mg via INTRAVENOUS
  Administered 2021-10-11: 20 mg via INTRAVENOUS

## 2021-10-11 MED ORDER — ONDANSETRON HCL 4 MG/2ML IJ SOLN
INTRAMUSCULAR | Status: DC | PRN
  Administered 2021-10-11: 4 mg via INTRAVENOUS

## 2021-10-11 MED ORDER — BUPIVACAINE-EPINEPHRINE (PF) 0.5% -1:200000 IJ SOLN
INTRAMUSCULAR | Status: AC
  Filled 2021-10-11: qty 30

## 2021-10-11 MED ORDER — FENTANYL CITRATE PF 50 MCG/ML IJ SOSY
PREFILLED_SYRINGE | INTRAMUSCULAR | Status: AC
  Administered 2021-10-11: 50 ug via INTRAVENOUS
  Filled 2021-10-11: qty 1

## 2021-10-11 MED ORDER — KETOROLAC TROMETHAMINE 30 MG/ML IJ SOLN
INTRAMUSCULAR | Status: AC
  Filled 2021-10-11: qty 1

## 2021-10-11 MED ORDER — CHLORHEXIDINE GLUCONATE 0.12 % MT SOLN
OROMUCOSAL | Status: AC
  Administered 2021-10-11: 15 mL via OROMUCOSAL
  Filled 2021-10-11: qty 15

## 2021-10-11 MED ORDER — FAMOTIDINE 20 MG PO TABS: 20.0000 mg | ORAL_TABLET | Freq: Once | ORAL | Status: AC

## 2021-10-11 MED ORDER — PROMETHAZINE HCL 25 MG/ML IJ SOLN: 6.2500 mg | INTRAMUSCULAR | Status: DC | PRN

## 2021-10-11 MED ORDER — DEXMEDETOMIDINE (PRECEDEX) IN NS 20 MCG/5ML (4 MCG/ML) IV SYRINGE
PREFILLED_SYRINGE | INTRAVENOUS | Status: AC
  Filled 2021-10-11: qty 5

## 2021-10-11 MED ORDER — EPHEDRINE SULFATE 50 MG/ML IJ SOLN
INTRAMUSCULAR | Status: DC | PRN
  Administered 2021-10-11: 10 mg via INTRAVENOUS
  Administered 2021-10-11: 15 mg via INTRAVENOUS

## 2021-10-11 MED ORDER — MIDAZOLAM HCL 2 MG/2ML IJ SOLN: 1.0000 mg | Freq: Once | INTRAMUSCULAR | Status: AC

## 2021-10-11 MED ORDER — SEVOFLURANE IN SOLN
RESPIRATORY_TRACT | Status: AC
  Filled 2021-10-11: qty 250

## 2021-10-11 MED ORDER — CEFAZOLIN SODIUM-DEXTROSE 2-4 GM/100ML-% IV SOLN
2.0000 g | INTRAVENOUS | Status: AC
  Administered 2021-10-11: 2 g via INTRAVENOUS

## 2021-10-11 MED ORDER — BUPIVACAINE-EPINEPHRINE 0.5% -1:200000 IJ SOLN
INTRAMUSCULAR | Status: DC | PRN
  Administered 2021-10-11: 30 mL

## 2021-10-11 MED ORDER — BETAMETHASONE DIPROPIONATE AUG 0.05 % EX CREA
1.0000 | TOPICAL_CREAM | Freq: Every day | CUTANEOUS | Status: DC | PRN
Start: 2021-10-11 — End: 2022-04-06

## 2021-10-11 MED ORDER — APREPITANT 40 MG PO CAPS: 40.0000 mg | ORAL_CAPSULE | Freq: Once | ORAL | Status: AC

## 2021-10-11 MED ORDER — FLUOCINONIDE 0.05 % EX SOLN: 1.0000 "application " | Freq: Every day | CUTANEOUS | Status: AC | PRN

## 2021-10-11 MED ORDER — LACTATED RINGERS IV SOLN: INTRAVENOUS | Status: DC

## 2021-10-11 MED ORDER — PHENYLEPHRINE HCL-NACL 20-0.9 MG/250ML-% IV SOLN
INTRAVENOUS | Status: DC | PRN
  Administered 2021-10-11: 20 ug/min via INTRAVENOUS

## 2021-10-11 MED ORDER — BUPIVACAINE LIPOSOME 1.3 % IJ SUSP
INTRAMUSCULAR | Status: AC
  Filled 2021-10-11: qty 20

## 2021-10-11 MED ORDER — LIDOCAINE HCL (PF) 1 % IJ SOLN
INTRAMUSCULAR | Status: DC | PRN
  Administered 2021-10-11: 3 mL

## 2021-10-11 MED ORDER — FENTANYL CITRATE (PF) 100 MCG/2ML IJ SOLN
INTRAMUSCULAR | Status: AC
  Filled 2021-10-11: qty 2

## 2021-10-11 MED ORDER — OXYCODONE HCL 5 MG PO TABS: 5.0000 mg | ORAL_TABLET | ORAL | 0 refills | Status: DC | PRN

## 2021-10-11 MED ORDER — CEFAZOLIN SODIUM-DEXTROSE 2-4 GM/100ML-% IV SOLN
INTRAVENOUS | Status: AC
  Filled 2021-10-11: qty 100

## 2021-10-11 MED ORDER — LACTATED RINGERS IV SOLN
INTRAVENOUS | Status: DC | PRN
  Administered 2021-10-11 (×2): 3000 mL

## 2021-10-11 MED ORDER — PROPOFOL 10 MG/ML IV BOLUS
INTRAVENOUS | Status: DC | PRN
  Administered 2021-10-11: 200 mg via INTRAVENOUS

## 2021-10-11 MED ORDER — FENTANYL CITRATE (PF) 100 MCG/2ML IJ SOLN: 25.0000 ug | INTRAMUSCULAR | Status: DC | PRN

## 2021-10-11 MED ORDER — DEXAMETHASONE SODIUM PHOSPHATE 10 MG/ML IJ SOLN
INTRAMUSCULAR | Status: DC | PRN
  Administered 2021-10-11: 10 mg via INTRAVENOUS

## 2021-10-11 MED ORDER — ONDANSETRON HCL 4 MG/2ML IJ SOLN
INTRAMUSCULAR | Status: AC
  Filled 2021-10-11: qty 2

## 2021-10-11 MED ORDER — CHLORHEXIDINE GLUCONATE 0.12 % MT SOLN: 15.0000 mL | Freq: Once | OROMUCOSAL | Status: AC

## 2021-10-11 MED ORDER — APREPITANT 40 MG PO CAPS
ORAL_CAPSULE | ORAL | Status: AC
  Administered 2021-10-11: 40 mg via ORAL
  Filled 2021-10-11: qty 1

## 2021-10-11 SURGICAL SUPPLY — 53 items
ANCHOR ALL-SUT Q-FIX 2.8 (Anchor) ×4 IMPLANT
ANCHOR HEALICOIL REGEN 5.5 (Anchor) ×4 IMPLANT
ANCHOR JUGGERKNOT WTAP NDL 2.9 (Anchor) ×2 IMPLANT
ANCHOR SUT QUATTRO KNTLS 4.5 (Anchor) IMPLANT
ANCHOR SUT W/ ORTHOCORD (Anchor) IMPLANT
BIT DRILL JUGRKNT W/NDL BIT2.9 (DRILL) ×1 IMPLANT
BLADE FULL RADIUS 3.5 (BLADE) ×2 IMPLANT
BUR ACROMIONIZER 4.0 (BURR) ×2 IMPLANT
CANNULA SHAVER 8MMX76MM (CANNULA) IMPLANT
CHLORAPREP W/TINT 26 (MISCELLANEOUS) ×2 IMPLANT
COVER MAYO STAND REUSABLE (DRAPES) ×2 IMPLANT
DILATOR 5.5 THREADED HEALICOIL (MISCELLANEOUS) ×2 IMPLANT
DRAPE IMP U-DRAPE 54X76 (DRAPES) ×4 IMPLANT
DRILL JUGGERKNOT W/NDL BIT 2.9 (DRILL) ×2
ELECT CAUTERY BLADE 6.4 (BLADE) ×2 IMPLANT
ELECT REM PT RETURN 9FT ADLT (ELECTROSURGICAL) ×2
ELECTRODE REM PT RTRN 9FT ADLT (ELECTROSURGICAL) ×1 IMPLANT
GAUZE SPONGE 4X4 12PLY STRL (GAUZE/BANDAGES/DRESSINGS) ×2 IMPLANT
GAUZE XEROFORM 1X8 LF (GAUZE/BANDAGES/DRESSINGS) ×2 IMPLANT
GLOVE SRG 8 PF TXTR STRL LF DI (GLOVE) ×1 IMPLANT
GLOVE SURG ENC MOIS LTX SZ7.5 (GLOVE) ×4 IMPLANT
GLOVE SURG ENC MOIS LTX SZ8 (GLOVE) ×4 IMPLANT
GLOVE SURG UNDER LTX SZ8 (GLOVE) ×2 IMPLANT
GLOVE SURG UNDER POLY LF SZ8 (GLOVE) ×1
GOWN STRL REUS W/ TWL LRG LVL3 (GOWN DISPOSABLE) ×1 IMPLANT
GOWN STRL REUS W/ TWL XL LVL3 (GOWN DISPOSABLE) ×1 IMPLANT
GOWN STRL REUS W/TWL LRG LVL3 (GOWN DISPOSABLE) ×1
GOWN STRL REUS W/TWL XL LVL3 (GOWN DISPOSABLE) ×1
GRASPER SUT 15 45D LOW PRO (SUTURE) IMPLANT
IV LACTATED RINGER IRRG 3000ML (IV SOLUTION) ×2
IV LR IRRIG 3000ML ARTHROMATIC (IV SOLUTION) ×2 IMPLANT
KIT CANNULA 8X76-LX IN CANNULA (CANNULA) ×2 IMPLANT
KIT SUTURE 2.8 Q-FIX DISP (MISCELLANEOUS) ×2 IMPLANT
MANIFOLD NEPTUNE II (INSTRUMENTS) ×4 IMPLANT
MASK FACE SPIDER DISP (MASK) ×2 IMPLANT
MAT ABSORB  FLUID 56X50 GRAY (MISCELLANEOUS) ×1
MAT ABSORB FLUID 56X50 GRAY (MISCELLANEOUS) ×1 IMPLANT
PACK ARTHROSCOPY SHOULDER (MISCELLANEOUS) ×2 IMPLANT
PASSER SUT FIRSTPASS SELF (INSTRUMENTS) ×2 IMPLANT
SLING ARM LRG DEEP (SOFTGOODS) ×2 IMPLANT
SLING ULTRA II LG (MISCELLANEOUS) ×2 IMPLANT
SPONGE T-LAP 18X18 ~~LOC~~+RFID (SPONGE) ×2 IMPLANT
STAPLER SKIN PROX 35W (STAPLE) ×2 IMPLANT
STRAP SAFETY 5IN WIDE (MISCELLANEOUS) ×2 IMPLANT
SUT ETHIBOND 0 MO6 C/R (SUTURE) ×2 IMPLANT
SUT ULTRABRAID 2 COBRAID 38 (SUTURE) IMPLANT
SUT VIC AB 2-0 CT1 27 (SUTURE) ×2
SUT VIC AB 2-0 CT1 TAPERPNT 27 (SUTURE) ×2 IMPLANT
TAPE MICROFOAM 4IN (TAPE) ×2 IMPLANT
TUBING CONNECTING 10 (TUBING) ×2 IMPLANT
TUBING INFLOW SET DBFLO PUMP (TUBING) ×2 IMPLANT
WAND WEREWOLF FLOW 90D (MISCELLANEOUS) ×2 IMPLANT
WATER STERILE IRR 500ML POUR (IV SOLUTION) ×2 IMPLANT

## 2021-10-11 NOTE — Anesthesia Preprocedure Evaluation (Signed)
Anesthesia Evaluation  Patient identified by MRN, date of birth, ID band Patient awake    Reviewed: Allergy & Precautions, NPO status , Patient's Chart, lab work & pertinent test results  History of Anesthesia Complications (+) PONV and history of anesthetic complications  Airway Mallampati: III  TM Distance: >3 FB Neck ROM: Limited    Dental no notable dental hx.    Pulmonary neg pulmonary ROS, neg sleep apnea, neg COPD,    breath sounds clear to auscultation- rhonchi (-) wheezing      Cardiovascular Exercise Tolerance: Good hypertension, Pt. on medications (-) CAD, (-) Past MI, (-) Cardiac Stents and (-) CABG  Rhythm:Regular Rate:Normal - Systolic murmurs and - Diastolic murmurs    Neuro/Psych neg Seizures negative neurological ROS  negative psych ROS   GI/Hepatic negative GI ROS, Neg liver ROS,   Endo/Other  negative endocrine ROSneg diabetes  Renal/GU negative Renal ROS     Musculoskeletal  (+) Arthritis ,   Abdominal (+) + obese,   Peds  Hematology negative hematology ROS (+)   Anesthesia Other Findings Past Medical History: No date: Arthritis No date: HLD (hyperlipidemia) No date: Hypertension No date: PONV (postoperative nausea and vomiting) No date: Psoriasis   Reproductive/Obstetrics                             Anesthesia Physical Anesthesia Plan  ASA: 2  Anesthesia Plan: General   Post-op Pain Management:  Regional for Post-op pain   Induction: Intravenous  PONV Risk Score and Plan: 3 and Ondansetron, Aprepitant, Dexamethasone and Midazolam  Airway Management Planned: Oral ETT  Additional Equipment:   Intra-op Plan:   Post-operative Plan: Extubation in OR  Informed Consent: I have reviewed the patients History and Physical, chart, labs and discussed the procedure including the risks, benefits and alternatives for the proposed anesthesia with the patient or  authorized representative who has indicated his/her understanding and acceptance.     Dental advisory given  Plan Discussed with: CRNA and Anesthesiologist  Anesthesia Plan Comments:         Anesthesia Quick Evaluation

## 2021-10-11 NOTE — Discharge Instructions (Addendum)
Orthopedic discharge instructions: Keep dressing dry and intact.  May shower after dressing changed on post-op day #4 (Saturday).  Cover staples/sutures with Band-Aids after drying off. Apply ice frequently to shoulder. Take ibuprofen 600-800 mg TID with meals for 7-10 days, then as necessary. Take oxycodone as prescribed when needed.  May supplement with ES Tylenol if necessary. Keep shoulder immobilizer on at all times except may remove for bathing purposes. Follow-up in 10-14 days or as scheduled.   AMBULATORY SURGERY  DISCHARGE INSTRUCTIONS   The drugs that you were given will stay in your system until tomorrow so for the next 24 hours you should not:  Drive an automobile Make any legal decisions Drink any alcoholic beverage   You may resume regular meals tomorrow.  Today it is better to start with liquids and gradually work up to solid foods.  You may eat anything you prefer, but it is better to start with liquids, then soup and crackers, and gradually work up to solid foods.   Please notify your doctor immediately if you have any unusual bleeding, trouble breathing, redness and pain at the surgery site, drainage, fever, or pain not relieved by medication.    Additional Instructions: PLEASE DO NO REMOVE TEAL EXPAREL BRACELET FOR 4 DAYS (96 hours)  Information for Discharge Teaching: EXPAREL (bupivacaine liposome injectable suspension)   Your surgeon or anesthesiologist gave you EXPAREL(bupivacaine) to help control your pain after surgery.  EXPAREL is a local anesthetic that provides pain relief by numbing the tissue around the surgical site. EXPAREL is designed to release pain medication over time and can control pain for up to 72 hours. Depending on how you respond to EXPAREL, you may require less pain medication during your recovery.  Possible side effects: Temporary loss of sensation or ability to move in the area where bupivacaine was injected. Nausea, vomiting,  constipation Rarely, numbness and tingling in your mouth or lips, lightheadedness, or anxiety may occur. Call your doctor right away if you think you may be experiencing any of these sensations, or if you have other questions regarding possible side effects.  Follow all other discharge instructions given to you by your surgeon or nurse. Eat a healthy diet and drink plenty of water or other fluids.  If you return to the hospital for any reason within 96 hours following the administration of EXPAREL, it is important for health care providers to know that you have received this anesthetic. A teal colored band has been placed on your arm with the date, time and amount of EXPAREL you have received in order to alert and inform your health care providers. Please leave this armband in place for the full 96 hours following administration, and then you may remove the band.        Please contact your physician with any problems or Same Day Surgery at [phone], Monday through Friday 6 am to 4 pm, or [place] at [facility] number at [phone].

## 2021-10-11 NOTE — Op Note (Signed)
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  Post-Op Diagnosis:   Impingement/tendinopathy with recurrent rotator cuff tear, degenerative joint disease, degenerative labral fraying, and biceps tendinopathy, right shoulder.  Procedure:   Extensive arthroscopic debridement, arthroscopic subacromial decompression, mini-open rotator cuff repair, and mini-open biceps tenodesis, right shoulder.  Anesthesia:   General endotracheal with interscalene block using Exparel placed preoperatively by the anesthesiologist.  Surgeon:   Pascal Lux, MD  Assistant:   Cameron Proud, PA-C; Douglass Rivers, PA-S  Findings:   As above. There was extensive degenerative labral fraying involving the anterior, superior, and posterior portions of the labrum without frank detachment from the glenoid rim. There was a recurrent near full-thickness tear of the supraspinatus tendon extending into the anterior fibers of the infraspinatus tendon with several areas of full-thickness tearing. The superior insertional fibers of the subscapularis tendon also had a small articular surface tear. There was moderate biceps tendinopathy without partial or full-thickness tearing. There were diffuse grade 3 chondromalacial changes involving the humerus and glenoid surfaces  Complications:   None  Fluids:   800 cc  Estimated blood loss:   10 cc  Tourniquet time:   None  Drains:   None  Closure:   Staples      Brief clinical note:   The patient is a 60 year old female with a several year history of right shoulder pain. She is now 17 years status post a mini open rotator cuff repair of her right shoulder. The patient's symptoms have progressed despite medications, activity modification, etc. The patient's history and examination are consistent with impingement/tendinopathy with a  probable recurrent rotator cuff tear and underlying degenerative joint disease. These findings were confirmed by MRI scan. The patient presents at this time for definitive management of these shoulder symptoms.  Procedure:   The patient underwent placement of an interscalene block using Exparel by the anesthesiologist in the preoperative holding area before being brought into the operating room and lain in the supine position. The patient then underwent general endotracheal intubation and anesthesia before being repositioned in the beach chair position using the beach chair positioner. The right shoulder and upper extremity were prepped with ChloraPrep solution before being draped sterilely. Preoperative antibiotics were administered. A timeout was performed to confirm the proper surgical site before the expected portal sites and incision site were injected with 0.5% Sensorcaine with epinephrine. A posterior portal was created and the glenohumeral joint thoroughly inspected with the findings as described above. An anterior portal was created using an outside-in technique. The labrum and rotator cuff were further probed, again confirming the above-noted findings. The areas of labral fraying were debrided back to stable margins using the full-radius resector. In addition, the torn margins of the supra subscapularis and supraspinatus tendons were debrided back to stable margins using the full-radius resector, as were areas of synovitis anteriorly and superiorly. Finally, areas of loose articular cartilage in both the glenoid and humeral surfaces were debrided back to stable margin using the full-radius resector. The ArthroCare wand was inserted and used to release the biceps tendon from its labral anchor. It also was used to obtain hemostasis as well as to "anneal" the labrum superiorly and anteriorly. The instruments were removed from the joint after suctioning the excess fluid.  The camera was repositioned  through the posterior portal into the subacromial space. A separate lateral portal was created  using an outside-in technique. The 3.5 mm full-radius resector was introduced and used to perform a subtotal bursectomy. The ArthroCare wand was then inserted and used to remove the periosteal tissue off the undersurface of the anterior third of the acromion as well as to recess the coracoacromial ligament from its attachment along the anterior and lateral margins of the acromion. The 4.0 mm acromionizing bur was introduced and used to complete the decompression by removing the undersurface of the anterior third of the acromion. The full radius resector was reintroduced to remove any residual bony debris before the ArthroCare wand was reintroduced to obtain hemostasis. The instruments were then removed from the subacromial space after suctioning the excess fluid.  An approximately 4-5 cm incision was made over the anterolateral aspect of the shoulder beginning at the anterolateral corner of the acromion and extending distally in line with the bicipital groove. This incision was carried down through the subcutaneous tissues to expose the deltoid fascia. The raphae between the anterior and middle thirds was identified and this plane developed to provide access into the subacromial space. Additional bursal tissues were debrided sharply using Metzenbaum scissors. The rotator cuff tear was readily identified. The margins were debrided sharply with a #15 blade and the exposed greater tuberosity roughened with a rongeur. The tear was repaired using two Smith [employer] Nephew 2.8 mm Q-Fix anchors. These sutures were then brought back laterally and secured using two Smith & Nephew Healicoil knotless RegeneSorb anchors to create a two-layer closure. An apparent watertight closure was obtained.  The bicipital groove was identified by palpation and opened for 1-1.5 cm. The biceps tendon stump was retrieved through this defect. The floor  of the bicipital groove was roughened with a curet before a Biomet 2.9 mm JuggerKnot anchor was inserted. Both sets of sutures were passed through the biceps tendon and tied securely to effect the tenodesis. The bicipital sheath was reapproximated using two #0 Ethibond interrupted sutures, incorporating the biceps tendon to further reinforce the tenodesis.  The wound was copiously irrigated with sterile saline solution before the deltoid raphae was reapproximated using 2-0 Vicryl interrupted sutures. The subcutaneous tissues were closed in two layers using 2-0 Vicryl interrupted sutures before the skin was closed using staples. The portal sites also were closed using staples. A sterile bulky dressing was applied to the shoulder before the arm was placed into a shoulder immobilizer. The patient was then awakened, extubated, and returned to the recovery room in satisfactory condition after tolerating the procedure well.

## 2021-10-11 NOTE — Transfer of Care (Signed)
Immediate Anesthesia Transfer of Care Note  Patient: Norma Hickman  Procedure(s) Performed: SHOULDER ARTHROSCOPY WITH EXTENSIVE DEBRIDEMENT,  DECOMPRESSION, ROTATOR CUFF REPAIR AND BICEPS TENODESIS (Right: Shoulder)  Patient Location: PACU  Anesthesia Type:MAC General ETT Level of Consciousness: awake  Airway & Oxygen Therapy: Patient Spontanous Breathing  Post-op Assessment: Report given to RN  Post vital signs: stable  Last Vitals:  Vitals Value Taken Time  BP 142/73 10/11/21 0946  Temp    Pulse 97 10/11/21 0948  Resp 22 10/11/21 0948  SpO2 95 % 10/11/21 0948  Vitals shown include unvalidated device data.  Last Pain:  Vitals:   10/11/21 0628  TempSrc: Temporal  PainSc: 0-No pain         Complications: No notable events documented.

## 2021-10-11 NOTE — Anesthesia Procedure Notes (Signed)
Procedure Name: Intubation Date/Time: 10/11/2021 7:36 AM Performed by: Carter Kitten, CRNA Pre-anesthesia Checklist: Patient identified, Patient being monitored, Timeout performed, Emergency Drugs available and Suction available Patient Re-evaluated:Patient Re-evaluated prior to induction Oxygen Delivery Method: Circle system utilized Preoxygenation: Pre-oxygenation with 100% oxygen Induction Type: IV induction Ventilation: Mask ventilation without difficulty Laryngoscope Size: 3 and McGraph Grade View: Grade I Tube type: Oral Tube size: 6.5 mm Number of attempts: 1 Airway Equipment and Method: Stylet Placement Confirmation: ETT inserted through vocal cords under direct vision, positive ETCO2 and breath sounds checked- equal and bilateral Secured at: 21 cm Tube secured with: Tape Dental Injury: Teeth and Oropharynx as per pre-operative assessment

## 2021-10-11 NOTE — Anesthesia Postprocedure Evaluation (Signed)
Anesthesia Post Note  Patient: Norma Hickman  Procedure(s) Performed: SHOULDER ARTHROSCOPY WITH EXTENSIVE DEBRIDEMENT,  DECOMPRESSION, ROTATOR CUFF REPAIR AND BICEPS TENODESIS (Right: Shoulder)  Patient location during evaluation: PACU Anesthesia Type: General Level of consciousness: awake and alert and oriented Pain management: pain level controlled Vital Signs Assessment: post-procedure vital signs reviewed and stable Respiratory status: spontaneous breathing, nonlabored ventilation and respiratory function stable Cardiovascular status: blood pressure returned to baseline and stable Postop Assessment: no signs of nausea or vomiting Anesthetic complications: no   No notable events documented.   Last Vitals:  Vitals:   10/11/21 1015 10/11/21 1023  BP: 137/70   Pulse: 88   Resp: 17 16  Temp:    SpO2: 91% 94%    Last Pain:  Vitals:   10/11/21 1023  TempSrc:   PainSc: 0-No pain                 Marajade Lei

## 2021-10-11 NOTE — Anesthesia Procedure Notes (Signed)
Anesthesia Regional Block: Interscalene brachial plexus block   Pre-Anesthetic Checklist: , timeout performed,  Correct Patient, Correct Site, Correct Laterality,  Correct Procedure, Correct Position, site marked,  Risks and benefits discussed,  Surgical consent,  Pre-op evaluation,  At surgeon's request and post-op pain management  Laterality: Right  Prep: chloraprep       Needles:  Injection technique: Single-shot  Needle Type: Stimiplex     Needle Length: 10cm  Needle Gauge: 21     Additional Needles:   Procedures:,,,, ultrasound used (permanent image in chart),,    Narrative:  Start time: 10/11/2021 7:20 AM End time: 10/11/2021 7:23 AM Injection made incrementally with aspirations every 5 mL.  Performed by: Personally  Anesthesiologist: Alver Fisher, MD  Additional Notes: Functioning IV was confirmed and monitors were applied.  A Stimuplex needle was used. Sterile prep and drape,hand hygiene and sterile gloves were used.  Negative aspiration and negative test dose prior to incremental administration of local anesthetic. The patient tolerated the procedure well.

## 2021-10-11 NOTE — H&P (Signed)
History of Present Illness:  Norma Hickman is a 60 y.o. female who presents today as a result of a referral from Horris Latino, [place], for right shoulder pain.   The patient's symptoms began several years ago and developed without any specific cause or injury. The patient has been dealing with other medical issues requiring her to put her shoulder symptoms on hold. However, she was complaining of increased shoulder pain at her carpal tunnel follow up with Horris Latino, PA-C. Therefore, he sent her for an arthro-CT scan of the right shoulder to rule out a recurrent rotator cuff tear and referred her to me for further evaluation and treatment. The patient describes the symptoms as moderate (patient is active but has had to make modifications or give up activities) and have the quality of being aching, miserable, nagging, stabbing, tender and throbbing. The pain is localized to the lateral arm/shoulder and localized to the anterior shoulder. These symptoms are aggravated with normal daily activities, with sleeping, carrying heavy objects, at higher levels of activity, with overhead activity and reaching behind the back. She has tried acetaminophen and non-steroidal anti-inflammatories (Mobic) with limited benefit. She has tried rest with no significant benefit. She has not tried any physical therapy or steroid injections for these symptoms recently. She denies any recent injury to the shoulder, and denies any numbness or paresthesias down her arm to her hand.  This complaint is not work related. She is a sports non-participant.  Shoulder Surgical History:  The patient has had a rotator cuff repair and a decompression in 2005.  PMH/PSH/Family History/Social History/Meds/Allergies:  I have reviewed past medical, surgical, social and family history, medications and allergies as documented in the EMR.  Current Outpatient Medications:  acetaminophen (TYLENOL) 500 MG tablet Take by mouth.   atorvastatin  (LIPITOR) 20 MG tablet TAKE 1 TABLET BY MOUTH ONCE DAILY 90 tablet 1   betamethasone dipropionate, augmented, (DIPROLENE-AF) 0.05 % cream Apply topically Apply a thin film to affected hands two times daily until clear   clobetasol (CORMAX) 0.05 % external solution APPLY TO AFFECTED AREA(S) TWICE A DAY 50 mL 1   escitalopram oxalate (LEXAPRO) 10 MG tablet Take 1 tablet (10 mg total) by mouth once daily for 90 days 90 tablet 2   fluocinonide (LIDEX) 0.05 % external solution   fluocinonide (LIDEX) 0.05 % external solution once daily   folic acid (FOLVITE) 1 MG tablet   folic acid (FOLVITE) 1 MG tablet Take 1 tablet by mouth once daily   gabapentin (NEURONTIN) 300 MG capsule Take 1 capsule by mouth nightly   ketoconazole (NIZORAL) 2 % shampoo once daily   lanolin alcohol-mo-w.pet-ceres (EUCERIN) cream Apply topically nightly as needed   lisinopriL-hydrochlorothiazide (ZESTORETIC) 20-12.5 mg tablet Take 2 tablets by mouth once daily 180 tablet 3   meloxicam (MOBIC) 7.5 MG tablet Take 1 tablet (7.5 mg total) by mouth once daily 30 tablet 0   methotrexate (RHEUMATREX) 2.5 MG tablet 8 tablets every Friday   secukinumab (COSENTYX PEN) 150 mg/mL PnIj Inject subcutaneously INJECT 300 MG INTO THE SKIN EVERY 28 (TWENTY-EIGHT) DAYS   traZODone (DESYREL) 50 MG tablet Take 1 tablet (50 mg total) by mouth at bedtime 30 tablet 5   triamcinolone 0.1 % cream once daily   No current Epic-ordered facility-administered medications on file.   No Known Allergies Past Medical History:  Diagnosis Date   Decreased hearing   History of cervical fracture   Hyperlipidemia   Hypertension   Past Surgical History:  Arthroscopic subacromial decompression, left shoulder 08/14/2001 (Dr. Gavin Potters)   foot surgery   Repair of Lumbar incisional hernia 08/24/2011 (Dr. Renda Rolls)   Right shoulder rotator cuff repair. Right 2005 (Dr. De Blanch)   [employer] PS Right TKA 11/26/2012 (Dr. 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 Single, lives with parents. No children. No tobacco or alcohol.   Review of Systems:  A comprehensive 14 point ROS was performed, reviewed, and the pertinent orthopaedic findings are documented in the HPI.  Physical Exam:  Vitals:  09/16/21 1326  BP: (!) 158/92  Weight: (!) 113.9 kg (251 lb)  Height: 167.6 cm (5\' 6" )  PainSc: 0-No pain  PainLoc: Shoulder   General/Constitutional: Pleasant significantly overweight middle-aged female in no acute distress. Neuro/Psych: Normal mood and affect, oriented to person, place and time. Eyes: Non-icteric. Pupils are equal, round, and reactive to light, and exhibit synchronous movement. ENT: Unremarkable. Lymphatic: No palpable adenopathy. Respiratory: Lungs clear to auscultation, Normal chest excursion, No wheezes and Non-labored breathing Cardiovascular: Regular rate and rhythm. No murmurs. and No edema, swelling or tenderness, except as noted in detailed exam. Integumentary: No impressive skin lesions present, except as noted in detailed exam. Musculoskeletal: Unremarkable, except as noted in detailed exam.  Right shoulder exam: SKIN: Well-healed surgical incisions, otherwise unremarkable SWELLING: none WARMTH: none LYMPH NODES: no adenopathy palpable CREPITUS: none TENDERNESS: Mildly tender along the anterolateral acromion ROM (active):  Forward flexion: 125  degrees Abduction: 115 degrees Internal rotation: Right PSIS ROM (passive):  Forward flexion: 140 degrees Abduction: 130 degrees ER/IR at 90 abd: 90 degrees / 70 degrees  She notes moderate pain with forward flexion, abduction, and internal rotation, and mild pain with internal and external rotation.  STRENGTH: Forward flexion: 4/5 Abduction: 4/5 External rotation: 4-4+/5 Internal rotation: 4+/5 Pain with RC testing: Mild-moderate pain with resisted forward flexion and abduction  STABILITY: Normal  SPECIAL TESTS: ' test: positive, moderate Speed's test: positive Capsulitis - pain w/ passive ER: no Crossed arm test: Mildly positive Crank: Not evaluated Anterior apprehension: Negative Posterior apprehension: Not evaluated  She is neurovascularly intact to the right upper extremity.  Right Shoulder Artho-CT: Arthro-CT shoulder Cartilage: Partial thickness humeral head cartilage loss. Partial thickness glenoid cartilage loss. Arthro-CT shoulder Rotator Cuff: Focal full-thickness tears of the supraspinatus and infraspinatus tendons. No retraction. Arthro-CT shoulder Labrum / Biceps: Biceps tendinopathy. Arthro-CT shoulder Bone: Normal bone.  Both the films and report were reviewed by myself and discussed with the patient.  Assessment:   Recurrent rotator cuff tear, right shoulder.   Primary osteoarthritis of right shoulder   Rotator cuff tendinitis, right   Plan:  The treatment options were discussed with the patient. In addition, patient educational materials were provided regarding the diagnosis and treatment options. The patient is quite frustrated by her symptoms and functional  limitations, and is ready to consider more aggressive treatment options. Therefore, I have recommended a surgical procedure, specifically a right shoulder arthroscopy with debridement, decompression, rotator cuff repair, and probable biceps tenodesis. The procedure was discussed with the  patient, as were the potential risks (including bleeding, infection, nerve and/or blood vessel injury, persistent or recurrent pain, failure of the repair, progression of arthritis, need for further surgery, blood clots, strokes, heart attacks and/or arhythmias, pneumonia, etc.) and benefits. The patient states her understanding and wishes to proceed. She understands that we will not eliminate the arthritis she has in her shoulder, but given her age and only mild arthritis, I feel that it would be best to avoid shoulder replacement surgery at this time. All of the patient's questions and concerns were answered. She can call any time with further concerns. She will follow up post-surgery, routine.   H&P reviewed and patient re-examined. No changes.

## 2021-10-17 ENCOUNTER — Other Ambulatory Visit: Payer: Self-pay

## 2021-10-17 ENCOUNTER — Encounter: Payer: Self-pay | Admitting: Physical Therapy

## 2021-10-17 ENCOUNTER — Ambulatory Visit: Payer: 59 | Attending: Surgery | Admitting: Physical Therapy

## 2021-10-17 DIAGNOSIS — M6281 Muscle weakness (generalized): Secondary | ICD-10-CM | POA: Diagnosis not present

## 2021-10-17 DIAGNOSIS — M25511 Pain in right shoulder: Secondary | ICD-10-CM | POA: Insufficient documentation

## 2021-10-17 DIAGNOSIS — M25611 Stiffness of right shoulder, not elsewhere classified: Secondary | ICD-10-CM | POA: Insufficient documentation
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
## 2021-10-19 ENCOUNTER — Encounter: Payer: Self-pay | Admitting: Physical Therapy

## 2021-10-19 ENCOUNTER — Ambulatory Visit: Payer: 59 | Admitting: Physical Therapy

## 2021-10-19 DIAGNOSIS — M25511 Pain in right shoulder: Secondary | ICD-10-CM | POA: Diagnosis not present

## 2021-10-19 DIAGNOSIS — M25611 Stiffness of right shoulder, not elsewhere classified: Secondary | ICD-10-CM

## 2021-10-19 DIAGNOSIS — M6281 Muscle weakness (generalized): Secondary | ICD-10-CM
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
## 2021-10-24 ENCOUNTER — Encounter: Payer: Self-pay | Admitting: Physical Therapy

## 2021-10-24 ENCOUNTER — Other Ambulatory Visit: Payer: Self-pay

## 2021-10-24 ENCOUNTER — Ambulatory Visit: Payer: 59 | Admitting: Physical Therapy

## 2021-10-24 DIAGNOSIS — M25511 Pain in right shoulder: Secondary | ICD-10-CM

## 2021-10-24 DIAGNOSIS — M6281 Muscle weakness (generalized): Secondary | ICD-10-CM

## 2021-10-24 DIAGNOSIS — M25611 Stiffness of right shoulder, not elsewhere classified: Secondary | ICD-10-CM
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
## 2021-10-26 ENCOUNTER — Encounter: Payer: Self-pay | Admitting: Physical Therapy

## 2021-10-26 ENCOUNTER — Ambulatory Visit: Payer: 59 | Admitting: Physical Therapy

## 2021-10-26 ENCOUNTER — Other Ambulatory Visit: Payer: Self-pay

## 2021-10-26 DIAGNOSIS — M25611 Stiffness of right shoulder, not elsewhere classified: Secondary | ICD-10-CM | POA: Diagnosis not present

## 2021-10-26 DIAGNOSIS — M25511 Pain in right shoulder: Secondary | ICD-10-CM | POA: Diagnosis not present

## 2021-10-26 DIAGNOSIS — M6281 Muscle weakness (generalized): Secondary | ICD-10-CM | POA: Diagnosis not present

## 2021-10-26 MED ORDER — LISINOPRIL-HYDROCHLOROTHIAZIDE 20-12.5 MG PO TABS
2.0000 | ORAL_TABLET | Freq: Every day | ORAL | 3 refills | Status: DC
  Filled 2021-10-26: qty 180, 90d supply, fill #0
  Filled 2022-02-14: qty 180, 90d supply, fill #1
  Filled 2022-06-19: qty 180, 90d supply, fill #2
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
## 2021-10-28 ENCOUNTER — Other Ambulatory Visit (HOSPITAL_COMMUNITY): Payer: Self-pay

## 2021-10-31 ENCOUNTER — Other Ambulatory Visit: Payer: Self-pay

## 2021-10-31 ENCOUNTER — Encounter: Payer: Self-pay | Admitting: Physical Therapy

## 2021-10-31 ENCOUNTER — Ambulatory Visit: Payer: 59 | Admitting: Physical Therapy

## 2021-10-31 DIAGNOSIS — M6281 Muscle weakness (generalized): Secondary | ICD-10-CM | POA: Diagnosis not present

## 2021-10-31 DIAGNOSIS — M25511 Pain in right shoulder: Secondary | ICD-10-CM

## 2021-10-31 DIAGNOSIS — M25611 Stiffness of right shoulder, not elsewhere classified: Secondary | ICD-10-CM | POA: Diagnosis not present
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
## 2021-11-01 ENCOUNTER — Telehealth: Payer: Self-pay | Admitting: Pharmacist

## 2021-11-01 NOTE — Telephone Encounter (Signed)
Called patient to schedule an appointment for the [facility] [employer] [facility]. I was unable to reach the patient so I left a HIPAA-compliant message requesting that the patient return my call.   Luke Van Ausdall, PharmD, BCACP, CPP Clinical Pharmacist [facility] [phone]  

## 2021-11-02 ENCOUNTER — Other Ambulatory Visit (HOSPITAL_COMMUNITY): Payer: Self-pay

## 2021-11-02 ENCOUNTER — Ambulatory Visit: Payer: 59 | Admitting: Physical Therapy

## 2021-11-07 ENCOUNTER — Ambulatory Visit: Payer: 59 | Admitting: Physical Therapy

## 2021-11-09 ENCOUNTER — Ambulatory Visit: Payer: 59 | Admitting: Physical Therapy

## 2021-11-09 ENCOUNTER — Other Ambulatory Visit: Payer: Self-pay

## 2021-11-09 ENCOUNTER — Ambulatory Visit: Payer: 59 | Attending: Surgery | Admitting: Physical Therapy

## 2021-11-09 DIAGNOSIS — M25611 Stiffness of right shoulder, not elsewhere classified: Secondary | ICD-10-CM | POA: Diagnosis not present

## 2021-11-09 DIAGNOSIS — M6281 Muscle weakness (generalized): Secondary | ICD-10-CM | POA: Diagnosis not present

## 2021-11-09 DIAGNOSIS — M25511 Pain in right shoulder: Secondary | ICD-10-CM | POA: Diagnosis not present
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
## 2021-11-10 ENCOUNTER — Encounter: Payer: Self-pay | Admitting: Physical Therapy

## 2021-11-10 ENCOUNTER — Ambulatory Visit: Payer: 59 | Admitting: Physical Therapy

## 2021-11-14 ENCOUNTER — Ambulatory Visit: Payer: 59 | Admitting: Physical Therapy

## 2021-11-14 ENCOUNTER — Other Ambulatory Visit: Payer: Self-pay

## 2021-11-14 ENCOUNTER — Encounter: Payer: Self-pay | Admitting: Physical Therapy

## 2021-11-14 DIAGNOSIS — M25511 Pain in right shoulder: Secondary | ICD-10-CM

## 2021-11-14 DIAGNOSIS — M25611 Stiffness of right shoulder, not elsewhere classified: Secondary | ICD-10-CM

## 2021-11-14 DIAGNOSIS — M6281 Muscle weakness (generalized): Secondary | ICD-10-CM
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
## 2021-11-16 ENCOUNTER — Ambulatory Visit: Payer: 59 | Admitting: Physical Therapy

## 2021-11-21 ENCOUNTER — Ambulatory Visit: Payer: 59 | Admitting: Physical Therapy

## 2021-11-22 ENCOUNTER — Other Ambulatory Visit (HOSPITAL_COMMUNITY): Payer: Self-pay

## 2021-11-23 ENCOUNTER — Encounter: Payer: Self-pay | Admitting: Physical Therapy

## 2021-11-23 ENCOUNTER — Other Ambulatory Visit: Payer: Self-pay

## 2021-11-23 ENCOUNTER — Ambulatory Visit: Payer: 59 | Admitting: Physical Therapy

## 2021-11-23 DIAGNOSIS — M25511 Pain in right shoulder: Secondary | ICD-10-CM | POA: Diagnosis not present

## 2021-11-23 DIAGNOSIS — M6281 Muscle weakness (generalized): Secondary | ICD-10-CM | POA: Diagnosis not present

## 2021-11-23 DIAGNOSIS — M25611 Stiffness of right shoulder, not elsewhere classified: Secondary | ICD-10-CM | POA: Diagnosis not present
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
## 2021-11-24 ENCOUNTER — Other Ambulatory Visit (HOSPITAL_COMMUNITY): Payer: Self-pay

## 2021-11-29 ENCOUNTER — Ambulatory Visit: Payer: 59 | Admitting: Physical Therapy

## 2021-11-30 ENCOUNTER — Ambulatory Visit: Payer: 59 | Admitting: Physical Therapy

## 2021-12-01 ENCOUNTER — Ambulatory Visit: Payer: 59 | Admitting: Physical Therapy

## 2021-12-01 ENCOUNTER — Other Ambulatory Visit (HOSPITAL_COMMUNITY): Payer: Self-pay

## 2021-12-07 ENCOUNTER — Other Ambulatory Visit: Payer: Self-pay

## 2021-12-07 ENCOUNTER — Encounter: Payer: Self-pay | Admitting: Physical Therapy

## 2021-12-07 ENCOUNTER — Ambulatory Visit: Payer: 59 | Attending: Surgery | Admitting: Physical Therapy

## 2021-12-07 DIAGNOSIS — M6281 Muscle weakness (generalized): Secondary | ICD-10-CM | POA: Diagnosis not present

## 2021-12-07 DIAGNOSIS — M25511 Pain in right shoulder: Secondary | ICD-10-CM | POA: Insufficient documentation

## 2021-12-07 DIAGNOSIS — M25611 Stiffness of right shoulder, not elsewhere classified: Secondary | ICD-10-CM | POA: Insufficient documentation
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
## 2021-12-12 ENCOUNTER — Other Ambulatory Visit: Payer: Self-pay

## 2021-12-12 ENCOUNTER — Other Ambulatory Visit (HOSPITAL_COMMUNITY): Payer: Self-pay

## 2021-12-12 ENCOUNTER — Ambulatory Visit: Payer: 59 | Admitting: Physical Therapy

## 2021-12-12 ENCOUNTER — Encounter: Payer: Self-pay | Admitting: Physical Therapy

## 2021-12-12 DIAGNOSIS — M6281 Muscle weakness (generalized): Secondary | ICD-10-CM

## 2021-12-12 DIAGNOSIS — M25511 Pain in right shoulder: Secondary | ICD-10-CM | POA: Diagnosis not present

## 2021-12-12 DIAGNOSIS — M25611 Stiffness of right shoulder, not elsewhere classified: Secondary | ICD-10-CM
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
## 2021-12-14 ENCOUNTER — Other Ambulatory Visit: Payer: Self-pay

## 2021-12-14 ENCOUNTER — Encounter: Payer: Self-pay | Admitting: Physical Therapy

## 2021-12-14 ENCOUNTER — Ambulatory Visit: Payer: 59 | Admitting: Physical Therapy

## 2021-12-14 DIAGNOSIS — M25611 Stiffness of right shoulder, not elsewhere classified: Secondary | ICD-10-CM | POA: Diagnosis not present

## 2021-12-14 DIAGNOSIS — M6281 Muscle weakness (generalized): Secondary | ICD-10-CM

## 2021-12-14 DIAGNOSIS — M25511 Pain in right shoulder: Secondary | ICD-10-CM
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
## 2021-12-15 ENCOUNTER — Other Ambulatory Visit: Payer: Self-pay

## 2021-12-19 ENCOUNTER — Ambulatory Visit: Payer: 59 | Admitting: Physical Therapy

## 2021-12-21 ENCOUNTER — Ambulatory Visit: Payer: 59 | Admitting: Physical Therapy

## 2021-12-21 ENCOUNTER — Encounter: Payer: Self-pay | Admitting: Physical Therapy

## 2021-12-21 DIAGNOSIS — M6281 Muscle weakness (generalized): Secondary | ICD-10-CM

## 2021-12-21 DIAGNOSIS — M25611 Stiffness of right shoulder, not elsewhere classified: Secondary | ICD-10-CM | POA: Diagnosis not present

## 2021-12-21 DIAGNOSIS — M25511 Pain in right shoulder: Secondary | ICD-10-CM | POA: Diagnosis not present
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
## 2021-12-23 ENCOUNTER — Other Ambulatory Visit: Payer: Self-pay

## 2021-12-23 ENCOUNTER — Encounter: Payer: Self-pay | Admitting: Physical Therapy

## 2021-12-23 ENCOUNTER — Ambulatory Visit: Payer: 59 | Admitting: Physical Therapy

## 2021-12-23 DIAGNOSIS — M25511 Pain in right shoulder: Secondary | ICD-10-CM

## 2021-12-23 DIAGNOSIS — M25611 Stiffness of right shoulder, not elsewhere classified: Secondary | ICD-10-CM

## 2021-12-23 DIAGNOSIS — M6281 Muscle weakness (generalized): Secondary | ICD-10-CM
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
## 2021-12-26 ENCOUNTER — Other Ambulatory Visit (HOSPITAL_COMMUNITY): Payer: Self-pay

## 2021-12-26 ENCOUNTER — Ambulatory Visit: Payer: 59 | Admitting: Physical Therapy

## 2021-12-28 ENCOUNTER — Other Ambulatory Visit (HOSPITAL_COMMUNITY): Payer: Self-pay

## 2021-12-28 ENCOUNTER — Ambulatory Visit: Payer: 59 | Admitting: Physical Therapy

## 2021-12-30 ENCOUNTER — Other Ambulatory Visit (HOSPITAL_COMMUNITY): Payer: Self-pay

## 2022-01-02 ENCOUNTER — Ambulatory Visit: Payer: 59 | Admitting: Physical Therapy

## 2022-01-02 ENCOUNTER — Other Ambulatory Visit: Payer: Self-pay

## 2022-01-02 ENCOUNTER — Encounter: Payer: Self-pay | Admitting: Physical Therapy

## 2022-01-02 DIAGNOSIS — M25611 Stiffness of right shoulder, not elsewhere classified: Secondary | ICD-10-CM

## 2022-01-02 DIAGNOSIS — M25511 Pain in right shoulder: Secondary | ICD-10-CM | POA: Diagnosis not present

## 2022-01-02 DIAGNOSIS — M6281 Muscle weakness (generalized): Secondary | ICD-10-CM | POA: Diagnosis not present
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
## 2022-01-04 ENCOUNTER — Other Ambulatory Visit: Payer: Self-pay

## 2022-01-04 ENCOUNTER — Encounter: Payer: Self-pay | Admitting: Physical Therapy

## 2022-01-04 ENCOUNTER — Ambulatory Visit: Payer: 59 | Attending: Surgery | Admitting: Physical Therapy

## 2022-01-04 DIAGNOSIS — M25511 Pain in right shoulder: Secondary | ICD-10-CM | POA: Insufficient documentation

## 2022-01-04 DIAGNOSIS — M25611 Stiffness of right shoulder, not elsewhere classified: Secondary | ICD-10-CM | POA: Diagnosis not present

## 2022-01-04 DIAGNOSIS — M6281 Muscle weakness (generalized): Secondary | ICD-10-CM | POA: Insufficient documentation
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
## 2022-01-05 ENCOUNTER — Other Ambulatory Visit: Payer: Self-pay

## 2022-01-09 ENCOUNTER — Other Ambulatory Visit (HOSPITAL_COMMUNITY): Payer: Self-pay

## 2022-01-09 ENCOUNTER — Ambulatory Visit: Payer: 59 | Admitting: Physical Therapy

## 2022-01-09 ENCOUNTER — Other Ambulatory Visit: Payer: Self-pay

## 2022-01-09 DIAGNOSIS — M25511 Pain in right shoulder: Secondary | ICD-10-CM

## 2022-01-09 DIAGNOSIS — M25611 Stiffness of right shoulder, not elsewhere classified: Secondary | ICD-10-CM | POA: Diagnosis not present

## 2022-01-09 DIAGNOSIS — M6281 Muscle weakness (generalized): Secondary | ICD-10-CM | POA: Diagnosis not present
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
## 2022-01-10 ENCOUNTER — Encounter: Payer: Self-pay | Admitting: Physical Therapy

## 2022-01-11 ENCOUNTER — Other Ambulatory Visit: Payer: Self-pay

## 2022-01-11 ENCOUNTER — Encounter: Payer: Self-pay | Admitting: Physical Therapy

## 2022-01-11 ENCOUNTER — Ambulatory Visit: Payer: 59 | Admitting: Physical Therapy

## 2022-01-11 DIAGNOSIS — M6281 Muscle weakness (generalized): Secondary | ICD-10-CM | POA: Diagnosis not present

## 2022-01-11 DIAGNOSIS — M25611 Stiffness of right shoulder, not elsewhere classified: Secondary | ICD-10-CM

## 2022-01-11 DIAGNOSIS — M25511 Pain in right shoulder: Secondary | ICD-10-CM | POA: Diagnosis not present
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
## 2022-01-16 ENCOUNTER — Other Ambulatory Visit: Payer: Self-pay

## 2022-01-16 ENCOUNTER — Ambulatory Visit: Payer: 59 | Admitting: Physical Therapy

## 2022-01-16 ENCOUNTER — Encounter: Payer: Self-pay | Admitting: Physical Therapy

## 2022-01-16 DIAGNOSIS — M6281 Muscle weakness (generalized): Secondary | ICD-10-CM

## 2022-01-16 DIAGNOSIS — M25611 Stiffness of right shoulder, not elsewhere classified: Secondary | ICD-10-CM

## 2022-01-16 DIAGNOSIS — M25511 Pain in right shoulder: Secondary | ICD-10-CM
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
## 2022-01-17 ENCOUNTER — Other Ambulatory Visit: Payer: Self-pay

## 2022-01-18 ENCOUNTER — Ambulatory Visit: Payer: 59 | Admitting: Physical Therapy

## 2022-01-18 ENCOUNTER — Other Ambulatory Visit: Payer: Self-pay

## 2022-01-18 ENCOUNTER — Encounter: Payer: Self-pay | Admitting: Physical Therapy

## 2022-01-18 DIAGNOSIS — M25611 Stiffness of right shoulder, not elsewhere classified: Secondary | ICD-10-CM

## 2022-01-18 DIAGNOSIS — M6281 Muscle weakness (generalized): Secondary | ICD-10-CM

## 2022-01-18 DIAGNOSIS — M25511 Pain in right shoulder: Secondary | ICD-10-CM | POA: Diagnosis not present
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
## 2022-01-23 ENCOUNTER — Encounter: Payer: Self-pay | Admitting: Physical Therapy

## 2022-01-23 ENCOUNTER — Other Ambulatory Visit: Payer: Self-pay

## 2022-01-23 ENCOUNTER — Ambulatory Visit: Payer: 59 | Admitting: Physical Therapy

## 2022-01-23 DIAGNOSIS — M6281 Muscle weakness (generalized): Secondary | ICD-10-CM | POA: Diagnosis not present

## 2022-01-23 DIAGNOSIS — M25611 Stiffness of right shoulder, not elsewhere classified: Secondary | ICD-10-CM | POA: Diagnosis not present

## 2022-01-23 DIAGNOSIS — M25511 Pain in right shoulder: Secondary | ICD-10-CM | POA: Diagnosis not present
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
## 2022-01-25 ENCOUNTER — Other Ambulatory Visit (HOSPITAL_COMMUNITY): Payer: Self-pay

## 2022-01-25 ENCOUNTER — Other Ambulatory Visit: Payer: Self-pay

## 2022-01-25 ENCOUNTER — Ambulatory Visit: Payer: 59 | Admitting: Physical Therapy

## 2022-01-25 DIAGNOSIS — M25511 Pain in right shoulder: Secondary | ICD-10-CM

## 2022-01-25 DIAGNOSIS — M6281 Muscle weakness (generalized): Secondary | ICD-10-CM | POA: Diagnosis not present

## 2022-01-25 DIAGNOSIS — M25611 Stiffness of right shoulder, not elsewhere classified: Secondary | ICD-10-CM | POA: Diagnosis not present
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
## 2022-01-26 ENCOUNTER — Encounter: Payer: Self-pay | Admitting: Physical Therapy

## 2022-01-30 ENCOUNTER — Other Ambulatory Visit: Payer: Self-pay

## 2022-01-30 ENCOUNTER — Encounter: Payer: Self-pay | Admitting: Physical Therapy

## 2022-01-30 ENCOUNTER — Ambulatory Visit: Payer: 59 | Admitting: Physical Therapy

## 2022-01-30 DIAGNOSIS — M6281 Muscle weakness (generalized): Secondary | ICD-10-CM

## 2022-01-30 DIAGNOSIS — M7581 Other shoulder lesions, right shoulder: Secondary | ICD-10-CM | POA: Diagnosis not present

## 2022-01-30 DIAGNOSIS — M19011 Primary osteoarthritis, right shoulder: Secondary | ICD-10-CM | POA: Diagnosis not present

## 2022-01-30 DIAGNOSIS — M25511 Pain in right shoulder: Secondary | ICD-10-CM

## 2022-01-30 DIAGNOSIS — M75111 Incomplete rotator cuff tear or rupture of right shoulder, not specified as traumatic: Secondary | ICD-10-CM | POA: Diagnosis not present

## 2022-01-30 DIAGNOSIS — M25611 Stiffness of right shoulder, not elsewhere classified: Secondary | ICD-10-CM | POA: Diagnosis not present

## 2022-01-30 DIAGNOSIS — M24111 Other articular cartilage disorders, right shoulder: Secondary | ICD-10-CM | POA: Diagnosis not present
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
## 2022-02-01 ENCOUNTER — Ambulatory Visit: Payer: 59 | Admitting: Physical Therapy

## 2022-02-03 ENCOUNTER — Other Ambulatory Visit: Payer: Self-pay

## 2022-02-08 ENCOUNTER — Encounter: Payer: 59 | Admitting: Physical Therapy

## 2022-02-13 ENCOUNTER — Encounter: Payer: Self-pay | Admitting: Physical Therapy

## 2022-02-13 ENCOUNTER — Other Ambulatory Visit: Payer: Self-pay

## 2022-02-13 ENCOUNTER — Ambulatory Visit: Payer: 59 | Attending: Surgery | Admitting: Physical Therapy

## 2022-02-13 DIAGNOSIS — M6281 Muscle weakness (generalized): Secondary | ICD-10-CM | POA: Diagnosis not present

## 2022-02-13 DIAGNOSIS — M25511 Pain in right shoulder: Secondary | ICD-10-CM | POA: Insufficient documentation

## 2022-02-13 DIAGNOSIS — M25611 Stiffness of right shoulder, not elsewhere classified: Secondary | ICD-10-CM | POA: Insufficient documentation
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
## 2022-02-14 ENCOUNTER — Other Ambulatory Visit: Payer: Self-pay

## 2022-02-15 ENCOUNTER — Other Ambulatory Visit: Payer: Self-pay

## 2022-02-15 ENCOUNTER — Ambulatory Visit: Payer: 59 | Admitting: Physical Therapy

## 2022-02-15 ENCOUNTER — Encounter: Payer: 59 | Admitting: Physical Therapy

## 2022-02-15 MED ORDER — CLINDAMYCIN HCL 150 MG PO CAPS
ORAL_CAPSULE | ORAL | 0 refills | Status: DC
  Filled 2022-02-15: qty 12, 2d supply, fill #0
  Filled 2022-02-15: qty 48, 8d supply, fill #0

## 2022-02-16 ENCOUNTER — Other Ambulatory Visit: Payer: Self-pay

## 2022-02-16 MED ORDER — TRAZODONE HCL 50 MG PO TABS
50.0000 mg | ORAL_TABLET | Freq: Every day | ORAL | 5 refills | Status: DC
  Filled 2022-02-16: qty 30, 30d supply, fill #0
  Filled 2022-03-21: qty 30, 30d supply, fill #1
  Filled 2022-04-20: qty 30, 30d supply, fill #2
  Filled 2022-06-19: qty 30, 30d supply, fill #3
  Filled 2022-08-14: qty 30, 30d supply, fill #4
  Filled 2022-11-06: qty 30, 30d supply, fill #5

## 2022-02-17 ENCOUNTER — Other Ambulatory Visit: Payer: Self-pay

## 2022-02-20 ENCOUNTER — Ambulatory Visit: Payer: 59 | Admitting: Physical Therapy

## 2022-02-20 ENCOUNTER — Encounter: Payer: 59 | Admitting: Physical Therapy

## 2022-02-21 ENCOUNTER — Other Ambulatory Visit (HOSPITAL_COMMUNITY): Payer: Self-pay

## 2022-02-22 ENCOUNTER — Encounter: Payer: 59 | Admitting: Physical Therapy

## 2022-02-22 ENCOUNTER — Other Ambulatory Visit: Payer: Self-pay

## 2022-02-22 ENCOUNTER — Ambulatory Visit: Payer: 59 | Admitting: Physical Therapy

## 2022-02-23 ENCOUNTER — Other Ambulatory Visit (HOSPITAL_COMMUNITY): Payer: Self-pay

## 2022-02-27 ENCOUNTER — Encounter: Payer: 59 | Admitting: Physical Therapy

## 2022-02-27 ENCOUNTER — Other Ambulatory Visit (HOSPITAL_COMMUNITY): Payer: Self-pay

## 2022-02-28 ENCOUNTER — Ambulatory Visit: Payer: 59 | Admitting: Physical Therapy

## 2022-02-28 ENCOUNTER — Other Ambulatory Visit: Payer: Self-pay

## 2022-02-28 ENCOUNTER — Encounter: Payer: Self-pay | Admitting: Physical Therapy

## 2022-02-28 DIAGNOSIS — M25511 Pain in right shoulder: Secondary | ICD-10-CM

## 2022-02-28 DIAGNOSIS — M6281 Muscle weakness (generalized): Secondary | ICD-10-CM | POA: Diagnosis not present

## 2022-02-28 DIAGNOSIS — M25611 Stiffness of right shoulder, not elsewhere classified: Secondary | ICD-10-CM
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
## 2022-03-01 ENCOUNTER — Other Ambulatory Visit: Payer: Self-pay

## 2022-03-01 ENCOUNTER — Ambulatory Visit: Payer: 59 | Admitting: Physical Therapy

## 2022-03-01 ENCOUNTER — Encounter: Payer: 59 | Admitting: Physical Therapy

## 2022-03-01 DIAGNOSIS — I10 Essential (primary) hypertension: Secondary | ICD-10-CM | POA: Diagnosis not present

## 2022-03-01 DIAGNOSIS — R7303 Prediabetes: Secondary | ICD-10-CM | POA: Diagnosis not present

## 2022-03-01 DIAGNOSIS — E785 Hyperlipidemia, unspecified: Secondary | ICD-10-CM | POA: Diagnosis not present

## 2022-03-01 DIAGNOSIS — E669 Obesity, unspecified: Secondary | ICD-10-CM | POA: Diagnosis not present

## 2022-03-01 MED ORDER — ALBUTEROL SULFATE HFA 108 (90 BASE) MCG/ACT IN AERS
INHALATION_SPRAY | RESPIRATORY_TRACT | 1 refills | Status: DC
  Filled 2022-03-01: qty 18, 25d supply, fill #0
  Filled 2022-11-06: qty 6.7, 25d supply, fill #1

## 2022-03-01 MED ORDER — AZITHROMYCIN 250 MG PO TABS
ORAL_TABLET | ORAL | 0 refills | Status: DC
  Filled 2022-03-01: qty 6, 5d supply, fill #0

## 2022-03-06 ENCOUNTER — Other Ambulatory Visit (HOSPITAL_COMMUNITY): Payer: Self-pay

## 2022-03-06 ENCOUNTER — Encounter: Payer: 59 | Admitting: Physical Therapy

## 2022-03-07 ENCOUNTER — Other Ambulatory Visit (HOSPITAL_COMMUNITY): Payer: Self-pay

## 2022-03-08 ENCOUNTER — Encounter: Payer: 59 | Admitting: Physical Therapy

## 2022-03-10 ENCOUNTER — Other Ambulatory Visit: Payer: Self-pay

## 2022-03-22 ENCOUNTER — Other Ambulatory Visit: Payer: Self-pay

## 2022-03-27 ENCOUNTER — Other Ambulatory Visit (HOSPITAL_COMMUNITY): Payer: Self-pay

## 2022-03-28 ENCOUNTER — Other Ambulatory Visit: Payer: Self-pay

## 2022-04-03 ENCOUNTER — Other Ambulatory Visit (HOSPITAL_COMMUNITY): Payer: Self-pay

## 2022-04-03 DIAGNOSIS — R0683 Snoring: Secondary | ICD-10-CM | POA: Diagnosis not present

## 2022-04-03 DIAGNOSIS — G4710 Hypersomnia, unspecified: Secondary | ICD-10-CM | POA: Diagnosis not present

## 2022-04-06 ENCOUNTER — Encounter: Payer: Self-pay | Admitting: Emergency Medicine

## 2022-04-06 ENCOUNTER — Ambulatory Visit
Admission: EM | Admit: 2022-04-06 | Discharge: 2022-04-06 | Disposition: A | Discharge status: Home / Self Care | Payer: 59 | Attending: Family Medicine | Admitting: Family Medicine

## 2022-04-06 ENCOUNTER — Other Ambulatory Visit: Payer: Self-pay

## 2022-04-06 DIAGNOSIS — L309 Dermatitis, unspecified: Secondary | ICD-10-CM

## 2022-04-06 MED ORDER — BETAMETHASONE DIPROPIONATE AUG 0.05 % EX CREA
1.0000 "application " | TOPICAL_CREAM | Freq: Every day | CUTANEOUS | 0 refills | Status: AC | PRN
  Filled 2022-04-06: qty 250, 30d supply, fill #0

## 2022-04-06 NOTE — ED Provider Notes (Signed)
?[facility] ? ? ? ?CSN: [id number] ?Arrival date & time: 04/06/22  1455 ? ? ?  ? ?History   ?Chief Complaint ?Chief Complaint  ?Patient presents with  ? Rash  ? ? ?HPI ?Norma Hickman is a 61 y.o. female.  ? ?HPI ? ?Patient presents for evaluation of rash times one week located on the bilateral abdomen. She thought she had shingles. She has been applying previously prescribed topical steroid cream with relief . The rash is itchy but does not burn.  The rash is flat and has not drained.  She was concerned that rash was related to possible shingles however the rash has never become painful. ? ?Past Medical History:  ?Diagnosis Date  ? Arthritis   ? HLD (hyperlipidemia)   ? Hypertension   ? PONV (postoperative nausea and vomiting)   ? Psoriasis   ? ? ?There are no problems to display for this patient. ? ? ?Past Surgical History:  ?Procedure Laterality Date  ? CARPAL TUNNEL RELEASE Right   ? FOOT SURGERY Right   ? HEEL  ? IR FLUORO GUIDED NEEDLE PLC ASPIRATION/INJECTION LOC  08/16/2021  ? JOINT REPLACEMENT Right   ? TOTAL KNEE  ? SHOULDER ARTHROSCOPY WITH SUBACROMIAL DECOMPRESSION, ROTATOR CUFF REPAIR AND BICEP TENDON REPAIR Right 10/11/2021  ? Procedure: SHOULDER ARTHROSCOPY WITH EXTENSIVE DEBRIDEMENT,  DECOMPRESSION, ROTATOR CUFF REPAIR AND BICEPS TENODESIS;  Surgeon: Christena FlakePoggi, John J, MD;  Location: [facility];  Service: Orthopedics;  Laterality: Right;  ? TONSILLECTOMY    ? ? ?OB History   ?No obstetric history on file. ?  ? ? ? ?Home Medications   ? ?Prior to Admission medications   ?Medication Sig Start Date End Date Taking? Authorizing Provider  ?acetaminophen (TYLENOL) 500 MG tablet Take 1,000 mg by mouth every 8 (eight) hours as needed for moderate pain.    [provider]  ?albuterol (VENTOLIN HFA) 108 (90 Base) MCG/ACT inhaler Inhale 2 inhalations into the lungs every 6 (six) hours as needed for Wheezing 03/01/22     ?atorvastatin (LIPITOR) 20 MG tablet TAKE 1 TABLET BY MOUTH ONCE DAILY  09/12/21     ?augmented betamethasone dipropionate (DIPROLENE-AF) 0.05 % cream Apply 1 application. topically daily as needed (psoriasis). 04/06/22   Bing NeighborsHarris, Jvion Turgeon S, [name]  ?azithromycin (ZITHROMAX) 250 MG tablet Take 2 tablets (500mg ) by mouth on Day 1. Take 1 tablet (250mg ) by mouth on Days 2-5. 03/01/22     ?clindamycin (CLEOCIN) 150 MG capsule Take 2 capsules by mouth three times a day 02/15/22     ?escitalopram (LEXAPRO) 10 MG tablet Take 1 tablet (10 mg total) by mouth once daily for 90 days 08/15/21     ?fluocinonide (LIDEX) 0.05 % external solution Apply 1 application topically daily as needed (scalp irritation). 10/11/21 10/11/22  [name], Excell SeltzerJohn J, MD  ?folic acid (FOLVITE) 1 MG tablet take one tablet daily on every day except day taking methotrexate. 04/06/21     ?gabapentin (NEURONTIN) 300 MG capsule TAKE 1 CAPSULE BY MOUTH ONCE DAILY AT BEDTIME 08/18/21 08/18/22    ?ibuprofen (ADVIL) 200 MG tablet Take 400 mg by mouth every 8 (eight) hours as needed for moderate pain.    [provider]  ?lisinopril-hydrochlorothiazide (ZESTORETIC) 20-12.5 MG tablet TAKE 2 TABLETS BY MOUTH ONCE DAILY 10/20/20 10/20/21  Patrice ParadiseMcLaughlin, Miriam K, MD  ?lisinopril-hydrochlorothiazide (ZESTORETIC) 20-12.5 MG tablet TAKE 2 TABLETS BY MOUTH ONCE DAILY 10/25/21     ?meloxicam (MOBIC) 7.5 MG tablet Take 1 tablet (7.5 mg total) by  mouth once daily 06/15/21     ?methotrexate 2.5 MG tablet Take 20 mg by mouth every Friday. Caution:Chemotherapy. Protect from light.    [provider]  ?oxyCODONE (OXY IR/ROXICODONE) 5 MG immediate release tablet TAKE 1-2 TABLETS BY MOUTH EVERY 4 HOURS AS NEEDED FOR SEVERE TO MODERATE PAIN ?Patient not taking: Reported on 10/17/2021 10/11/21     ?oxyCODONE (ROXICODONE) 5 MG immediate release tablet Take 1-2 tablets (5-10 mg total) by mouth every 4 (four) hours as needed for severe pain or moderate pain. ?Patient not taking: Reported on 10/17/2021 10/11/21   [name], [name], MD  ?Secukinumab, 300 MG Dose,  (COSENTYX SENSOREADY, 300 MG,) 150 MG/ML SOAJ INJECT 300 MG INTO THE SKIN EVERY 28 (TWENTY-EIGHT) DAYS. 05/27/21 05/27/22  Quentin Angst, MD  ?traZODone (DESYREL) 50 MG tablet Take 1 tablet (50 mg total) by mouth at bedtime 02/16/22     ? ? ?Family History ?History reviewed. No pertinent family history. ? ?Social History ?Social History  ? ?Tobacco Use  ? Smoking status: Never  ? Smokeless tobacco: Never  ?Vaping Use  ? Vaping Use: Never used  ?Substance Use Topics  ? Alcohol use: No  ?  Alcohol/week: 0.0 standard drinks  ? Drug use: Never  ? ? ? ?Allergies   ?Oxycodone ? ? ?Review of Systems ?Review of Systems ?Pertinent negatives listed in HPI  ? ?Physical Exam ?Triage Vital Signs ?ED Triage Vitals  ?Enc Vitals Group  ?   BP 04/06/22 1513 (!) 166/90  ?   Pulse Rate 04/06/22 1513 81  ?   Resp 04/06/22 1513 16  ?   Temp 04/06/22 1513 98.8 ?F (37.1 ?C)  ?   Temp Source 04/06/22 1513 Oral  ?   SpO2 04/06/22 1513 98 %  ?   Weight --   ?   Height --   ?   Head Circumference --   ?   Peak Flow --   ?   Pain Score 04/06/22 1514 0  ?   Pain Loc --   ?   Pain Edu? --   ?   Excl. in GC? --   ? ?No data found. ? ?Updated Vital Signs ?BP (!) 166/90 (BP Location: Left Arm)   Pulse 81   Temp 98.8 ?F (37.1 ?C) (Oral)   Resp 16   SpO2 98%  ? ?Visual Acuity ?Right Eye Distance:   ?Left Eye Distance:   ?Bilateral Distance:   ? ?Right Eye Near:   ?Left Eye Near:    ?Bilateral Near:    ? ?Physical Exam ?Constitutional:   ?   Appearance: Normal appearance.  ?Cardiovascular:  ?   Rate and Rhythm: Normal rate and regular rhythm.  ?Pulmonary:  ?   Effort: Pulmonary effort is normal.  ?   Breath sounds: Normal breath sounds.  ?Skin: ?   Capillary Refill: Capillary refill takes less than 2 seconds.  ?   Findings: Erythema and rash present.  ? ?    ?Neurological:  ?   General: No focal deficit present.  ?   Mental Status: She is alert and oriented to person, place, and time.  ?Psychiatric:     ?   Mood and Affect: Mood normal.     ?    Behavior: Behavior normal.  ? ? ? ?UC Treatments / Results  ?Labs ?(all labs ordered are listed, but only abnormal results are displayed) ?Labs Reviewed - No data to display ? ?EKG ? ? ?Radiology ?No results  found. ? ?Procedures ?Procedures (including critical care time) ? ?Medications Ordered in UC ?Medications - No data to display ? ?Initial Impression / Assessment and Plan / UC Course  ?I have reviewed the triage vital signs and the nursing notes. ? ?Pertinent labs & imaging results that were available during my care of the patient were reviewed by me and considered in my medical decision making (see chart for details). ? ?  ?Patient has already been treated with a steroid cream for psoriasis advised to use on dermatitis rash.  Follow-up with your PCP if symptoms do not resolve or if they worsen. ?Final Clinical Impressions(s) / UC Diagnoses  ? ?Final diagnoses:  ?Dermatitis  ? ? ? ?Discharge Instructions   ? ?  ?Apply your already prescribed steroid cream directly to the areas on the lateral aspect of your abdomen.  This is a dermatitis type of rash that can flareup with changes in weather, changes in detergents and for sometimes unknown reasons.  You should apply the cream twice daily for at least a period of 7 days or longer if needed. ? ? ? ? ?ED Prescriptions   ? ? Medication Sig Dispense Auth. Provider  ? augmented betamethasone dipropionate (DIPROLENE-AF) 0.05 % cream Apply 1 application. topically daily as needed (psoriasis). 250 g [name], [name]  ? ?  ? ?PDMP not reviewed this encounter. ?  ?[name], [name] ?04/07/22 1700 ? ?

## 2022-04-06 NOTE — ED Triage Notes (Signed)
Pt presents with a rash on her abdomen x q week.  ?

## 2022-04-06 NOTE — Discharge Instructions (Signed)
Apply your already prescribed steroid cream directly to the areas on the lateral aspect of your abdomen.  This is a dermatitis type of rash that can flareup with changes in weather, changes in detergents and for sometimes unknown reasons.  You should apply the cream twice daily for at least a period of 7 days or longer if needed. ?

## 2022-04-07 ENCOUNTER — Other Ambulatory Visit: Payer: Self-pay

## 2022-04-09 DIAGNOSIS — G4733 Obstructive sleep apnea (adult) (pediatric): Secondary | ICD-10-CM | POA: Diagnosis not present

## 2022-04-10 ENCOUNTER — Other Ambulatory Visit: Payer: Self-pay

## 2022-04-20 ENCOUNTER — Other Ambulatory Visit: Payer: Self-pay

## 2022-04-21 ENCOUNTER — Other Ambulatory Visit: Payer: Self-pay

## 2022-04-21 DIAGNOSIS — G4733 Obstructive sleep apnea (adult) (pediatric): Secondary | ICD-10-CM | POA: Diagnosis not present

## 2022-04-26 ENCOUNTER — Other Ambulatory Visit (HOSPITAL_COMMUNITY): Payer: Self-pay

## 2022-04-27 ENCOUNTER — Other Ambulatory Visit: Payer: Self-pay

## 2022-04-28 ENCOUNTER — Other Ambulatory Visit: Payer: Self-pay

## 2022-05-03 ENCOUNTER — Other Ambulatory Visit (HOSPITAL_COMMUNITY): Payer: Self-pay

## 2022-05-03 ENCOUNTER — Other Ambulatory Visit: Payer: Self-pay

## 2022-05-04 ENCOUNTER — Other Ambulatory Visit: Payer: Self-pay

## 2022-05-05 ENCOUNTER — Other Ambulatory Visit: Payer: Self-pay

## 2022-05-08 ENCOUNTER — Other Ambulatory Visit: Payer: Self-pay

## 2022-05-09 ENCOUNTER — Other Ambulatory Visit: Payer: Self-pay

## 2022-05-19 ENCOUNTER — Other Ambulatory Visit (HOSPITAL_COMMUNITY): Payer: Self-pay

## 2022-05-22 DIAGNOSIS — G4733 Obstructive sleep apnea (adult) (pediatric): Secondary | ICD-10-CM | POA: Diagnosis not present

## 2022-05-25 ENCOUNTER — Other Ambulatory Visit (HOSPITAL_COMMUNITY): Payer: Self-pay

## 2022-05-30 ENCOUNTER — Other Ambulatory Visit: Payer: Self-pay

## 2022-05-30 ENCOUNTER — Telehealth: Payer: Self-pay | Admitting: Pharmacist

## 2022-05-30 ENCOUNTER — Other Ambulatory Visit (HOSPITAL_COMMUNITY): Payer: Self-pay

## 2022-06-15 ENCOUNTER — Other Ambulatory Visit (HOSPITAL_COMMUNITY): Payer: Self-pay

## 2022-06-19 ENCOUNTER — Other Ambulatory Visit: Payer: Self-pay

## 2022-06-20 ENCOUNTER — Other Ambulatory Visit: Payer: Self-pay

## 2022-06-21 ENCOUNTER — Other Ambulatory Visit: Payer: Self-pay

## 2022-06-21 ENCOUNTER — Other Ambulatory Visit (HOSPITAL_COMMUNITY): Payer: Self-pay

## 2022-06-21 DIAGNOSIS — G4733 Obstructive sleep apnea (adult) (pediatric): Secondary | ICD-10-CM | POA: Diagnosis not present

## 2022-06-21 DIAGNOSIS — L400 Psoriasis vulgaris: Secondary | ICD-10-CM | POA: Diagnosis not present

## 2022-06-21 MED ORDER — KETOCONAZOLE 2 % EX SHAM
MEDICATED_SHAMPOO | CUTANEOUS | 11 refills | Status: DC
  Filled 2022-06-21: qty 120, 30d supply, fill #0
  Filled 2022-08-16: qty 120, 30d supply, fill #1
  Filled 2023-02-21: qty 120, 30d supply, fill #2
  Filled ????-??-??: fill #2

## 2022-06-21 MED ORDER — FLUOCINONIDE 0.05 % EX SOLN
CUTANEOUS | 2 refills | Status: AC
  Filled 2022-06-21: qty 60, 30d supply, fill #0

## 2022-06-21 MED ORDER — FLUOCINONIDE 0.05 % EX CREA
TOPICAL_CREAM | CUTANEOUS | 1 refills | Status: AC
  Filled 2022-06-21: qty 60, 15d supply, fill #0

## 2022-06-22 ENCOUNTER — Other Ambulatory Visit: Payer: Self-pay

## 2022-06-23 ENCOUNTER — Other Ambulatory Visit (HOSPITAL_COMMUNITY): Payer: Self-pay

## 2022-06-23 ENCOUNTER — Other Ambulatory Visit: Payer: Self-pay

## 2022-06-24 ENCOUNTER — Other Ambulatory Visit: Payer: Self-pay

## 2022-06-26 ENCOUNTER — Other Ambulatory Visit: Payer: Self-pay

## 2022-06-26 ENCOUNTER — Other Ambulatory Visit (HOSPITAL_COMMUNITY): Payer: Self-pay

## 2022-06-26 MED ORDER — ESCITALOPRAM OXALATE 10 MG PO TABS
10.0000 mg | ORAL_TABLET | Freq: Every day | ORAL | 0 refills | Status: DC
  Filled 2022-06-26 – 2023-02-21 (×3): qty 90, 90d supply, fill #0

## 2022-06-27 ENCOUNTER — Other Ambulatory Visit: Payer: Self-pay

## 2022-06-27 ENCOUNTER — Other Ambulatory Visit (HOSPITAL_COMMUNITY): Payer: Self-pay

## 2022-06-28 ENCOUNTER — Other Ambulatory Visit (HOSPITAL_COMMUNITY): Payer: Self-pay

## 2022-06-29 ENCOUNTER — Other Ambulatory Visit: Payer: Self-pay

## 2022-06-29 ENCOUNTER — Other Ambulatory Visit (HOSPITAL_COMMUNITY): Payer: Self-pay

## 2022-06-29 DIAGNOSIS — L400 Psoriasis vulgaris: Secondary | ICD-10-CM | POA: Diagnosis not present

## 2022-06-30 ENCOUNTER — Other Ambulatory Visit (HOSPITAL_COMMUNITY): Payer: Self-pay

## 2022-07-05 ENCOUNTER — Other Ambulatory Visit (HOSPITAL_COMMUNITY): Payer: Self-pay

## 2022-07-06 ENCOUNTER — Other Ambulatory Visit (HOSPITAL_COMMUNITY): Payer: Self-pay

## 2022-07-07 ENCOUNTER — Other Ambulatory Visit (HOSPITAL_COMMUNITY): Payer: Self-pay

## 2022-07-10 ENCOUNTER — Other Ambulatory Visit (HOSPITAL_COMMUNITY): Payer: Self-pay

## 2022-07-14 ENCOUNTER — Other Ambulatory Visit (HOSPITAL_COMMUNITY): Payer: Self-pay

## 2022-07-17 ENCOUNTER — Other Ambulatory Visit: Payer: Self-pay

## 2022-07-18 ENCOUNTER — Other Ambulatory Visit: Payer: Self-pay

## 2022-07-22 DIAGNOSIS — G4733 Obstructive sleep apnea (adult) (pediatric): Secondary | ICD-10-CM | POA: Diagnosis not present

## 2022-07-25 ENCOUNTER — Other Ambulatory Visit (HOSPITAL_COMMUNITY): Payer: Self-pay

## 2022-07-27 ENCOUNTER — Other Ambulatory Visit: Payer: Self-pay

## 2022-07-27 ENCOUNTER — Encounter: Payer: Self-pay | Admitting: Pharmacist

## 2022-07-29 ENCOUNTER — Other Ambulatory Visit (HOSPITAL_COMMUNITY): Payer: Self-pay

## 2022-08-01 ENCOUNTER — Other Ambulatory Visit (HOSPITAL_COMMUNITY): Payer: Self-pay

## 2022-08-04 DIAGNOSIS — G4733 Obstructive sleep apnea (adult) (pediatric): Secondary | ICD-10-CM | POA: Diagnosis not present

## 2022-08-10 ENCOUNTER — Other Ambulatory Visit (HOSPITAL_COMMUNITY): Payer: Self-pay

## 2022-08-10 ENCOUNTER — Other Ambulatory Visit: Payer: Self-pay

## 2022-08-10 DIAGNOSIS — G4733 Obstructive sleep apnea (adult) (pediatric): Secondary | ICD-10-CM | POA: Diagnosis not present

## 2022-08-14 ENCOUNTER — Other Ambulatory Visit: Payer: Self-pay

## 2022-08-15 ENCOUNTER — Other Ambulatory Visit (HOSPITAL_COMMUNITY): Payer: Self-pay

## 2022-08-15 ENCOUNTER — Ambulatory Visit
Admission: RE | Admit: 2022-08-15 | Discharge: 2022-08-15 | Disposition: A | Discharge status: Home / Self Care | Payer: 59 | Source: Ambulatory Visit | Attending: Urgent Care | Admitting: Urgent Care

## 2022-08-15 VITALS — BP 143/87 | HR 89 | Temp 98.8°F | Resp 14

## 2022-08-15 DIAGNOSIS — M109 Gout, unspecified: Secondary | ICD-10-CM | POA: Diagnosis not present

## 2022-08-15 MED ORDER — COLCHICINE 0.6 MG PO TABS
ORAL_TABLET | ORAL | 0 refills | Status: DC
  Filled 2022-08-15: qty 30, 10d supply, fill #0

## 2022-08-15 NOTE — ED Triage Notes (Signed)
Patient c/o RT elbow pain x 1 week.   Patient denies trama to area.   Patient endorses warmth to touch. Patient endorses pain to site   Patient has tried drinking cherry juice with no relief of symptoms

## 2022-08-15 NOTE — Discharge Instructions (Addendum)
Please follow-up with your primary care provider regarding your symptoms of pain in your right elbow.

## 2022-08-15 NOTE — ED Provider Notes (Signed)
Renaldo Fiddler    CSN: [id number] Arrival date & time: 08/15/22  1854      History   Chief Complaint Chief Complaint  Patient presents with   Joint Pain   APPT 1915    HPI Norma Hickman is a 61 y.o. female.   HPI  Patient presents to primary care with report of right elbow pain x1 week.  She denies history of gout.  Denies trauma to the elbow.  Endorses warmth to touch.   Drinking cherry juice for presumed gout without relief of symptoms.  Past Medical History:  Diagnosis Date   Arthritis    HLD (hyperlipidemia)    Hypertension    PONV (postoperative nausea and vomiting)    Psoriasis     There are no problems to display for this patient.   Past Surgical History:  Procedure Laterality Date   CARPAL TUNNEL RELEASE Right    FOOT SURGERY Right    HEEL   IR FLUORO GUIDED NEEDLE PLC ASPIRATION/INJECTION LOC  08/16/2021   JOINT REPLACEMENT Right    TOTAL KNEE   SHOULDER ARTHROSCOPY WITH SUBACROMIAL DECOMPRESSION, ROTATOR CUFF REPAIR AND BICEP TENDON REPAIR Right 10/11/2021   Procedure: SHOULDER ARTHROSCOPY WITH EXTENSIVE DEBRIDEMENT,  DECOMPRESSION, ROTATOR CUFF REPAIR AND BICEPS TENODESIS;  Surgeon: Christena Flake, MD;  Location: [facility];  Service: Orthopedics;  Laterality: Right;   TONSILLECTOMY      OB History   No obstetric history on file.      Home Medications    Prior to Admission medications   Medication Sig Start Date End Date Taking? Authorizing Provider  atorvastatin (LIPITOR) 20 MG tablet TAKE 1 TABLET BY MOUTH ONCE DAILY 09/12/21  Yes   colchicine 0.6 MG tablet Take 2 tablets (1.2 mg) at the first sign of flare, followed by 1 tablet (0.6 mg) after 1 hour; maximum total dose: 1.8 mg/day on day 1. Initiate treatment as soon as possible, ideally within 12 to 24 hours of flare onset. 08/15/22  Yes Hashim Eichhorst, Jeannett Senior, [name]  escitalopram (LEXAPRO) 10 MG tablet Take 1 tablet (10 mg total) by mouth once daily for 90 days 06/26/22  Yes   gabapentin  (NEURONTIN) 300 MG capsule TAKE 1 CAPSULE BY MOUTH ONCE DAILY AT BEDTIME 08/18/21 08/18/22 Yes   lisinopril-hydrochlorothiazide (ZESTORETIC) 20-12.5 MG tablet TAKE 2 TABLETS BY MOUTH ONCE DAILY 10/25/21  Yes   methotrexate 2.5 MG tablet Take 20 mg by mouth every Friday. Caution:Chemotherapy. Protect from light.   Yes [provider]  traZODone (DESYREL) 50 MG tablet Take 1 tablet (50 mg total) by mouth at bedtime 02/16/22  Yes   acetaminophen (TYLENOL) 500 MG tablet Take 1,000 mg by mouth every 8 (eight) hours as needed for moderate pain.    [provider]  albuterol (VENTOLIN HFA) 108 (90 Base) MCG/ACT inhaler Inhale 2 inhalations into the lungs every 6 (six) hours as needed for Wheezing 03/01/22     augmented betamethasone dipropionate (DIPROLENE-AF) 0.05 % cream Apply 1 application. topically daily as needed (psoriasis). 04/06/22   [name], [name]  clindamycin (CLEOCIN) 150 MG capsule Take 2 capsules by mouth three times a day 02/15/22     fluocinonide (LIDEX) 0.05 % external solution Apply 1 application topically daily as needed (scalp irritation). 10/11/21 10/11/22  [name], [name], MD  fluocinonide (LIDEX) 0.05 % external solution Apply thin layer to affected areas of scalp qd prn flares. Do not apply on clear skin 06/21/22     fluocinonide cream (LIDEX)  0.05 % Apply thin layer to affected areas bid prn flares. Do not use on clear skin 06/21/22     folic acid (FOLVITE) 1 MG tablet take one tablet daily on every day except day taking methotrexate. 04/06/21     ibuprofen (ADVIL) 200 MG tablet Take 400 mg by mouth every 8 (eight) hours as needed for moderate pain.    [provider]  ketoconazole (NIZORAL) 2 % shampoo Use as shampoo every other day.  Leave on for 5 minutes before rinsing. 06/21/22     lisinopril-hydrochlorothiazide (ZESTORETIC) 20-12.5 MG tablet TAKE 2 TABLETS BY MOUTH ONCE DAILY 10/20/20 10/20/21  [name], MD  meloxicam (MOBIC) 7.5 MG tablet Take 1  tablet (7.5 mg total) by mouth once daily 06/15/21     oxyCODONE (OXY IR/ROXICODONE) 5 MG immediate release tablet TAKE 1-2 TABLETS BY MOUTH EVERY 4 HOURS AS NEEDED FOR SEVERE TO MODERATE PAIN Patient not taking: Reported on 10/17/2021 10/11/21     oxyCODONE (ROXICODONE) 5 MG immediate release tablet Take 1-2 tablets (5-10 mg total) by mouth every 4 (four) hours as needed for severe pain or moderate pain. Patient not taking: Reported on 10/17/2021 10/11/21   [name], [name], MD    Family History History reviewed. No pertinent family history.  Social History Social History   Tobacco Use   Smoking status: Never   Smokeless tobacco: Never  Vaping Use   Vaping Use: Never used  Substance Use Topics   Alcohol use: No    Alcohol/week: 0.0 standard drinks of alcohol   Drug use: Never     Allergies   Oxycodone   Review of Systems Review of Systems   Physical Exam Triage Vital Signs ED Triage Vitals  Enc Vitals Group     BP 08/15/22 1921 (!) 143/87     Pulse Rate 08/15/22 1921 89     Resp 08/15/22 1921 14     Temp 08/15/22 1921 98.8 F (37.1 C)     Temp Source 08/15/22 1921 Oral     SpO2 08/15/22 1921 97 %     Weight --      Height --      Head Circumference --      Peak Flow --      Pain Score 08/15/22 1920 5     Pain Loc --      Pain Edu? --      Excl. in GC? --    No data found.  Updated Vital Signs BP (!) 143/87 (BP Location: Left Arm)   Pulse 89   Temp 98.8 F (37.1 C) (Oral)   Resp 14   SpO2 97%   Visual Acuity Right Eye Distance:   Left Eye Distance:   Bilateral Distance:    Right Eye Near:   Left Eye Near:    Bilateral Near:     Physical Exam Vitals reviewed.  Constitutional:      Appearance: Normal appearance.  Musculoskeletal:     Right elbow: Effusion present. Tenderness present in olecranon process.  Skin:    General: Skin is warm and dry.  Neurological:     General: No focal deficit present.     Mental Status: She is alert and oriented  to person, place, and time.  Psychiatric:        Mood and Affect: Mood normal.        Behavior: Behavior normal.      UC Treatments / Results  Labs (all labs ordered are listed,  but only abnormal results are displayed) Labs Reviewed - No data to display  EKG   Radiology No results found.  Procedures Procedures (including critical care time)  Medications Ordered in UC Medications - No data to display  Initial Impression / Assessment and Plan / UC Course  I have reviewed the triage vital signs and the nursing notes.  Pertinent labs & imaging results that were available during my care of the patient were reviewed by me and considered in my medical decision making (see chart for details).   Possibly gout versus bursitis versus infectious process.  Will provide colchicine for presumed gout and ask her to follow-up with her primary care provider.  Return for evaluation if symptoms worsen, or signs of infection develop such as fever, swelling at the site, redness at the site, worsening pain.   Final Clinical Impressions(s) / UC Diagnoses   Final diagnoses:  Acute gout of right elbow, unspecified cause     Discharge Instructions      Please follow-up with your primary care provider regarding your symptoms of pain in your right elbow.   ED Prescriptions     Medication Sig Dispense Auth. Provider   colchicine 0.6 MG tablet Take 2 tablets (1.2 mg) at the first sign of flare, followed by 1 tablet (0.6 mg) after 1 hour; maximum total dose: 1.8 mg/day on day 1. Initiate treatment as soon as possible, ideally within 12 to 24 hours of flare onset. 30 tablet Towana Stenglein, [name]      PDMP not reviewed this encounter.   Charma Igo, [place] 08/15/22 1953

## 2022-08-16 ENCOUNTER — Other Ambulatory Visit: Payer: Self-pay

## 2022-08-17 ENCOUNTER — Other Ambulatory Visit (HOSPITAL_COMMUNITY): Payer: Self-pay

## 2022-08-31 ENCOUNTER — Other Ambulatory Visit (HOSPITAL_COMMUNITY): Payer: Self-pay

## 2022-08-31 ENCOUNTER — Other Ambulatory Visit: Payer: Self-pay

## 2022-08-31 DIAGNOSIS — I10 Essential (primary) hypertension: Secondary | ICD-10-CM | POA: Diagnosis not present

## 2022-08-31 DIAGNOSIS — E785 Hyperlipidemia, unspecified: Secondary | ICD-10-CM | POA: Diagnosis not present

## 2022-08-31 DIAGNOSIS — M109 Gout, unspecified: Secondary | ICD-10-CM | POA: Diagnosis not present

## 2022-08-31 DIAGNOSIS — J0110 Acute frontal sinusitis, unspecified: Secondary | ICD-10-CM | POA: Diagnosis not present

## 2022-08-31 DIAGNOSIS — R7303 Prediabetes: Secondary | ICD-10-CM | POA: Diagnosis not present

## 2022-08-31 DIAGNOSIS — Z0000 Encounter for general adult medical examination without abnormal findings: Secondary | ICD-10-CM | POA: Diagnosis not present

## 2022-08-31 DIAGNOSIS — Z1211 Encounter for screening for malignant neoplasm of colon: Secondary | ICD-10-CM | POA: Diagnosis not present

## 2022-08-31 DIAGNOSIS — Z23 Encounter for immunization: Secondary | ICD-10-CM | POA: Diagnosis not present

## 2022-08-31 MED ORDER — ESCITALOPRAM OXALATE 10 MG PO TABS
10.0000 mg | ORAL_TABLET | Freq: Every day | ORAL | 1 refills | Status: DC
  Filled 2022-08-31: qty 90, 90d supply, fill #0
  Filled 2022-11-23: qty 90, 90d supply, fill #1

## 2022-08-31 MED ORDER — GABAPENTIN 300 MG PO CAPS
300.0000 mg | ORAL_CAPSULE | Freq: Every day | ORAL | 1 refills | Status: DC
  Filled 2022-08-31: qty 90, 90d supply, fill #0
  Filled 2022-11-23: qty 90, 90d supply, fill #1

## 2022-08-31 MED ORDER — TRAZODONE HCL 100 MG PO TABS
100.0000 mg | ORAL_TABLET | Freq: Every day | ORAL | 1 refills | Status: DC
  Filled 2022-08-31: qty 90, 90d supply, fill #0
  Filled 2022-11-23: qty 90, 90d supply, fill #1

## 2022-08-31 MED ORDER — LISINOPRIL-HYDROCHLOROTHIAZIDE 20-12.5 MG PO TABS
2.0000 | ORAL_TABLET | Freq: Every day | ORAL | 1 refills | Status: DC
  Filled 2022-11-06 – 2022-11-23 (×2): qty 180, 90d supply, fill #0
  Filled ????-??-??: fill #1

## 2022-08-31 MED ORDER — ATORVASTATIN CALCIUM 20 MG PO TABS
ORAL_TABLET | ORAL | 1 refills | Status: DC
  Filled 2022-08-31: qty 90, 90d supply, fill #0
  Filled 2023-02-21: qty 90, 90d supply, fill #1
  Filled ????-??-??: fill #1

## 2022-08-31 MED ORDER — AMOXICILLIN 875 MG PO TABS
875.0000 mg | ORAL_TABLET | Freq: Two times a day (BID) | ORAL | 0 refills | Status: DC
  Filled 2022-08-31: qty 20, 10d supply, fill #0

## 2022-09-06 ENCOUNTER — Other Ambulatory Visit: Payer: Self-pay

## 2022-09-06 MED ORDER — FREESTYLE LANCETS MISC
1 refills | Status: AC
  Filled 2022-09-06: qty 200, 90d supply, fill #0

## 2022-09-06 MED ORDER — METFORMIN HCL 500 MG PO TABS
500.0000 mg | ORAL_TABLET | Freq: Every day | ORAL | 2 refills | Status: DC
  Filled 2022-09-06: qty 30, 30d supply, fill #0

## 2022-09-06 MED ORDER — FREESTYLE FREEDOM LITE W/DEVICE KIT
PACK | 0 refills | Status: AC
  Filled 2022-09-06: qty 1, 1d supply, fill #0

## 2022-09-06 MED ORDER — FREESTYLE LITE TEST VI STRP
ORAL_STRIP | 2 refills | Status: AC
  Filled 2022-09-06: qty 200, 90d supply, fill #0

## 2022-09-06 MED ORDER — ALLOPURINOL 100 MG PO TABS
ORAL_TABLET | ORAL | 2 refills | Status: DC
  Filled 2022-09-06: qty 30, 30d supply, fill #0
  Filled 2023-02-21: qty 30, 30d supply, fill #1
  Filled 2023-04-23: qty 30, 30d supply, fill #2
  Filled ????-??-??: fill #1

## 2022-09-15 ENCOUNTER — Other Ambulatory Visit (HOSPITAL_COMMUNITY): Payer: Self-pay

## 2022-09-25 ENCOUNTER — Other Ambulatory Visit: Payer: Self-pay

## 2022-09-25 DIAGNOSIS — R350 Frequency of micturition: Secondary | ICD-10-CM | POA: Diagnosis not present

## 2022-09-25 DIAGNOSIS — R300 Dysuria: Secondary | ICD-10-CM | POA: Diagnosis not present

## 2022-09-25 MED ORDER — CIPROFLOXACIN HCL 250 MG PO TABS
ORAL_TABLET | ORAL | 0 refills | Status: DC
  Filled 2022-09-25: qty 14, 7d supply, fill #0

## 2022-10-03 ENCOUNTER — Other Ambulatory Visit (HOSPITAL_COMMUNITY): Payer: Self-pay

## 2022-10-03 ENCOUNTER — Other Ambulatory Visit: Payer: Self-pay

## 2022-10-10 ENCOUNTER — Other Ambulatory Visit (HOSPITAL_COMMUNITY): Payer: Self-pay

## 2022-10-13 ENCOUNTER — Other Ambulatory Visit: Payer: Self-pay

## 2022-10-13 ENCOUNTER — Other Ambulatory Visit (HOSPITAL_COMMUNITY): Payer: Self-pay

## 2022-10-13 DIAGNOSIS — J40 Bronchitis, not specified as acute or chronic: Secondary | ICD-10-CM | POA: Diagnosis not present

## 2022-10-13 DIAGNOSIS — R7303 Prediabetes: Secondary | ICD-10-CM | POA: Diagnosis not present

## 2022-10-13 DIAGNOSIS — I10 Essential (primary) hypertension: Secondary | ICD-10-CM | POA: Diagnosis not present

## 2022-10-13 MED ORDER — HYDROCOD POLI-CHLORPHE POLI ER 10-8 MG/5ML PO SUER
5.0000 mL | Freq: Two times a day (BID) | ORAL | 0 refills | Status: DC | PRN
  Filled 2022-10-13: qty 70, 7d supply, fill #0

## 2022-10-13 MED ORDER — DOXYCYCLINE HYCLATE 100 MG PO TABS
100.0000 mg | ORAL_TABLET | Freq: Two times a day (BID) | ORAL | 0 refills | Status: DC
  Filled 2022-10-13: qty 14, 7d supply, fill #0

## 2022-10-13 MED ORDER — PREDNISONE 10 MG PO TABS
ORAL_TABLET | ORAL | 0 refills | Status: AC
  Filled 2022-10-13: qty 20, 8d supply, fill #0

## 2022-10-23 ENCOUNTER — Other Ambulatory Visit (HOSPITAL_COMMUNITY): Payer: Self-pay

## 2022-11-06 ENCOUNTER — Other Ambulatory Visit: Payer: Self-pay

## 2022-11-13 ENCOUNTER — Encounter: Payer: Self-pay | Admitting: Emergency Medicine

## 2022-11-13 ENCOUNTER — Emergency Department: Payer: 59

## 2022-11-13 ENCOUNTER — Emergency Department
Admission: EM | Admit: 2022-11-13 | Discharge: 2022-11-13 | Disposition: A | Discharge status: Home / Self Care | Payer: 59 | Attending: Emergency Medicine | Admitting: Emergency Medicine

## 2022-11-13 ENCOUNTER — Other Ambulatory Visit: Payer: Self-pay

## 2022-11-13 DIAGNOSIS — M25512 Pain in left shoulder: Secondary | ICD-10-CM | POA: Insufficient documentation

## 2022-11-13 DIAGNOSIS — R079 Chest pain, unspecified: Secondary | ICD-10-CM | POA: Diagnosis not present

## 2022-11-13 DIAGNOSIS — R0789 Other chest pain: Secondary | ICD-10-CM | POA: Insufficient documentation

## 2022-11-13 DIAGNOSIS — I10 Essential (primary) hypertension: Secondary | ICD-10-CM | POA: Diagnosis not present

## 2022-11-13 LAB — TROPONIN I (HIGH SENSITIVITY)
Troponin I (High Sensitivity): 3 ng/L (ref <18)
Troponin I (High Sensitivity): 3 ng/L (ref <18)

## 2022-11-13 LAB — BASIC METABOLIC PANEL
Anion gap: 11 (ref 5–15)
BUN: 18 mg/dL (ref 6–20)
CO2: 26 mmol/L (ref 22–32)
Calcium: 9.2 mg/dL (ref 8.9–10.3)
Chloride: 102 mmol/L (ref 98–111)
Creatinine, Ser: 0.85 mg/dL (ref 0.44–1.00)
GFR, Estimated: >60 mL/min (ref >60)
Glucose, Bld: 141 mg/dL — ABNORMAL HIGH (ref 70–99)
Potassium: 4.1 mmol/L (ref 3.5–5.1)
Sodium: 139 mmol/L (ref 135–145)

## 2022-11-13 LAB — CBC
HCT: 42.4 % (ref 36.0–46.0)
Hemoglobin: 13.5 g/dL (ref 12.0–15.0)
MCH: 30.5 pg (ref 26.0–34.0)
MCHC: 31.8 g/dL (ref 30.0–36.0)
MCV: 95.7 fL (ref 80.0–100.0)
Platelets: 241 10*3/uL (ref 150–400)
RBC: 4.43 MIL/uL (ref 3.87–5.11)
RDW: 13.4 % (ref 11.5–15.5)
WBC: 11.1 10*3/uL — ABNORMAL HIGH (ref 4.0–10.5)
nRBC: 0 % (ref 0.0–0.2)

## 2022-11-13 MED ORDER — LIDOCAINE 5 % EX PTCH
1.0000 | MEDICATED_PATCH | CUTANEOUS | Status: DC
  Administered 2022-11-13: 1 via TRANSDERMAL
  Filled 2022-11-13: qty 1

## 2022-11-13 MED ORDER — KETOROLAC TROMETHAMINE 30 MG/ML IJ SOLN
30.0000 mg | Freq: Once | INTRAMUSCULAR | Status: AC
  Administered 2022-11-13: 30 mg via INTRAMUSCULAR
  Filled 2022-11-13: qty 1

## 2022-11-13 MED ORDER — IOHEXOL 350 MG/ML SOLN
75.0000 mL | Freq: Once | INTRAVENOUS | Status: AC | PRN
  Administered 2022-11-13: 75 mL via INTRAVENOUS

## 2022-11-13 MED ORDER — KETOROLAC TROMETHAMINE 30 MG/ML IJ SOLN
15.0000 mg | Freq: Once | INTRAMUSCULAR | Status: AC
  Administered 2022-11-13: 15 mg via INTRAVENOUS
  Filled 2022-11-13: qty 1

## 2022-11-13 NOTE — ED Provider Notes (Signed)
[facility] Provider Note    Event Date/Time   First MD Initiated Contact with Patient 11/13/22 [phone]     (approximate)  History   Chief Complaint: Shoulder Pain  HPI  Norma Hickman is a 61 y.o. female with a past medical history of hypertension, hyperlipidemia, thoracic outlet syndrome presents to the emergency department for left shoulder pain.  According to the patient for the past 2 to 3 days she has been experiencing pain in her left chest shoulder and into the left neck.  Patient states a history of thoracic outlet syndrome states this was diagnosed 20+ years ago and she has not had any recent imaging.  Patient is not sure of the severity of her syndrome.  Denies any shortness of breath.  Denies any cough or fever.  States the pain is worse with movement of the left shoulder.  Denies any trauma.  Physical Exam   Triage Vital Signs: ED Triage Vitals  Enc Vitals Group     BP 11/13/22 0341 (!) 166/77     Pulse Rate 11/13/22 0341 98     Resp 11/13/22 0341 20     Temp 11/13/22 0349 98.4 F (36.9 C)     Temp Source 11/13/22 0341 Oral     SpO2 11/13/22 0341 100 %     Weight 11/13/22 0345 235 lb (106.6 kg)     Height 11/13/22 0345 5\' 6"  (1.676 m)     Head Circumference --      Peak Flow --      Pain Score 11/13/22 0341 10     Pain Loc --      Pain Edu? --      Excl. in GC? --     Most recent vital signs: Vitals:   11/13/22 0727 11/13/22 0805  BP: (!) 130/99 134/75  Pulse: 88 72  Resp: 18 16  Temp:  97.7 F (36.5 C)  SpO2: 96% 95%    General: Awake, no distress.  CV:  Good peripheral perfusion.  Regular rate and rhythm  Resp:  Normal effort.  Equal breath sounds bilaterally.  Abd:  No distention.  Soft, nontender.  No rebound or guarding. Other:  Moderate left shoulder and left upper chest tenderness to palpation.  Pain with range of motion but neurovascular intact on the left upper extremity.   ED Results / Procedures / Treatments    EKG  EKG viewed and interpreted by myself shows a normal sinus rhythm at 95 bpm with a narrow QRS, normal axis, normal intervals, no concerning ST changes.  RADIOLOGY  I have reviewed and interpreted the chest x-ray images I do not see any obvious consolidation on my evaluation. Radiology has read the chest x-ray is negative   MEDICATIONS ORDERED IN ED: Medications  lidocaine (LIDODERM) 5 % 1 patch (1 patch Transdermal Patch Applied 11/13/22 0546)  iohexol (OMNIPAQUE) 350 MG/ML injection 75 mL (has no administration in time range)  ketorolac (TORADOL) 30 MG/ML injection 30 mg (30 mg Intramuscular Given 11/13/22 0547)  ketorolac (TORADOL) 30 MG/ML injection 15 mg (15 mg Intravenous Given 11/13/22 0803)     IMPRESSION / MDM / ASSESSMENT AND PLAN / ED COURSE  I reviewed the triage vital signs and the nursing notes.  Patient's presentation is most consistent with acute presentation with potential threat to life or bodily function.  Patient presents emergency department for 2 to 3 days of left upper chest/left shoulder pain worse with movement.  Pain is reproducible  on palpation.  Highly suspect musculoskeletal pain.  Patient's lab work is reassuring including negative troponin x 2, normal CBC, reassuring chemistry.  Patient's chest x-ray is clear and EKG reassuring as well.  However given the patient's history of thoracic outlet syndrome we will obtain CTA of the chest as the patient does not have any imaging in 20+ years to assess severity however I do believe is much more likely that this is more musculoskeletal pain.  Patient is agreeable and wishes to proceed with CTA of the chest as a precaution.  CTA negative.  Will discharge with PCP follow-up.  Patient agreeable to plan.  FINAL CLINICAL IMPRESSION(S) / ED DIAGNOSES   Left shoulder/left chest pain    Note:  This document was prepared using Dragon voice recognition software and may include unintentional dictation errors.    Minna Antis, MD 11/13/22 [phone]

## 2022-11-13 NOTE — ED Triage Notes (Signed)
Pt to ED via POV. Pt states hx of thoracic outlet syndrome. Pt states severe L shoulder pain that started several days ago and has progressively worsened. Pt states pain 10/10 to L shoulder. Pt states pain radiates up into her neck and L shoulder.

## 2022-11-23 ENCOUNTER — Other Ambulatory Visit: Payer: Self-pay

## 2022-11-23 MED ORDER — LISINOPRIL-HYDROCHLOROTHIAZIDE 20-12.5 MG PO TABS
2.0000 | ORAL_TABLET | Freq: Every day | ORAL | 1 refills | Status: DC
  Filled 2022-11-23 – 2023-02-21 (×2): qty 180, 90d supply, fill #0
  Filled 2023-06-28: qty 180, 90d supply, fill #1

## 2023-01-15 DIAGNOSIS — M75111 Incomplete rotator cuff tear or rupture of right shoulder, not specified as traumatic: Secondary | ICD-10-CM | POA: Diagnosis not present

## 2023-01-15 DIAGNOSIS — M7581 Other shoulder lesions, right shoulder: Secondary | ICD-10-CM | POA: Diagnosis not present

## 2023-01-15 DIAGNOSIS — M19011 Primary osteoarthritis, right shoulder: Secondary | ICD-10-CM | POA: Diagnosis not present

## 2023-01-18 ENCOUNTER — Other Ambulatory Visit: Payer: Self-pay | Admitting: Student

## 2023-01-18 DIAGNOSIS — M7581 Other shoulder lesions, right shoulder: Secondary | ICD-10-CM

## 2023-01-18 DIAGNOSIS — M19011 Primary osteoarthritis, right shoulder: Secondary | ICD-10-CM

## 2023-01-18 DIAGNOSIS — M75111 Incomplete rotator cuff tear or rupture of right shoulder, not specified as traumatic: Secondary | ICD-10-CM

## 2023-02-05 ENCOUNTER — Ambulatory Visit
Admission: RE | Admit: 2023-02-05 | Discharge: 2023-02-05 | Disposition: A | Discharge status: Home / Self Care | Payer: 59 | Source: Ambulatory Visit | Attending: Student | Admitting: Student

## 2023-02-05 ENCOUNTER — Ambulatory Visit: Payer: 59

## 2023-02-08 DIAGNOSIS — G4733 Obstructive sleep apnea (adult) (pediatric): Secondary | ICD-10-CM | POA: Diagnosis not present

## 2023-02-19 ENCOUNTER — Other Ambulatory Visit: Payer: Self-pay

## 2023-02-20 ENCOUNTER — Other Ambulatory Visit: Payer: Self-pay

## 2023-02-21 ENCOUNTER — Other Ambulatory Visit: Payer: Self-pay

## 2023-02-22 ENCOUNTER — Other Ambulatory Visit: Payer: Self-pay

## 2023-02-22 ENCOUNTER — Ambulatory Visit: Admission: RE | Admit: 2023-02-22 | Payer: 59 | Source: Ambulatory Visit

## 2023-02-22 MED ORDER — TRAZODONE HCL 50 MG PO TABS
100.0000 mg | ORAL_TABLET | Freq: Every day | ORAL | 0 refills | Status: DC
  Filled 2023-02-22: qty 180, 90d supply, fill #0

## 2023-02-22 MED ORDER — LISINOPRIL-HYDROCHLOROTHIAZIDE 20-12.5 MG PO TABS
2.0000 | ORAL_TABLET | Freq: Every day | ORAL | 1 refills | Status: DC
  Filled 2023-02-22 – 2024-02-05 (×2): qty 180, 90d supply, fill #0

## 2023-02-23 ENCOUNTER — Other Ambulatory Visit: Payer: Self-pay

## 2023-02-25 ENCOUNTER — Other Ambulatory Visit: Payer: Self-pay

## 2023-02-25 MED ORDER — GABAPENTIN 300 MG PO CAPS
300.0000 mg | ORAL_CAPSULE | Freq: Every day | ORAL | 1 refills | Status: DC
  Filled 2023-02-25: qty 90, 90d supply, fill #0
  Filled 2023-05-23: qty 90, 90d supply, fill #1

## 2023-02-27 ENCOUNTER — Ambulatory Visit
Admission: RE | Admit: 2023-02-27 | Discharge: 2023-02-27 | Disposition: A | Discharge status: Home / Self Care | Payer: 59 | Source: Ambulatory Visit | Attending: Student | Admitting: Student

## 2023-02-27 DIAGNOSIS — M7581 Other shoulder lesions, right shoulder: Secondary | ICD-10-CM

## 2023-02-27 DIAGNOSIS — M25511 Pain in right shoulder: Secondary | ICD-10-CM | POA: Diagnosis not present

## 2023-02-27 DIAGNOSIS — M75111 Incomplete rotator cuff tear or rupture of right shoulder, not specified as traumatic: Secondary | ICD-10-CM | POA: Diagnosis not present

## 2023-02-27 DIAGNOSIS — M19011 Primary osteoarthritis, right shoulder: Secondary | ICD-10-CM | POA: Insufficient documentation

## 2023-02-27 MED ORDER — IOHEXOL 180 MG/ML  SOLN
20.0000 mL | Freq: Once | INTRAMUSCULAR | Status: AC | PRN
  Administered 2023-02-27: 20 mL

## 2023-02-27 MED ORDER — LIDOCAINE HCL (PF) 1 % IJ SOLN
5.0000 mL | Freq: Once | INTRAMUSCULAR | Status: AC
  Administered 2023-02-27: 5 mL via INTRADERMAL
  Filled 2023-02-27: qty 5

## 2023-02-27 MED ORDER — SODIUM CHLORIDE (PF) 0.9 % IJ SOLN
10.0000 mL | INTRAMUSCULAR | Status: DC | PRN
  Administered 2023-02-27: 10 mL

## 2023-03-05 ENCOUNTER — Other Ambulatory Visit: Payer: Self-pay

## 2023-03-05 DIAGNOSIS — E785 Hyperlipidemia, unspecified: Secondary | ICD-10-CM | POA: Diagnosis not present

## 2023-03-05 DIAGNOSIS — E669 Obesity, unspecified: Secondary | ICD-10-CM | POA: Diagnosis not present

## 2023-03-05 DIAGNOSIS — L282 Other prurigo: Secondary | ICD-10-CM | POA: Diagnosis not present

## 2023-03-05 DIAGNOSIS — L409 Psoriasis, unspecified: Secondary | ICD-10-CM | POA: Diagnosis not present

## 2023-03-05 DIAGNOSIS — I10 Essential (primary) hypertension: Secondary | ICD-10-CM | POA: Diagnosis not present

## 2023-03-05 DIAGNOSIS — R7303 Prediabetes: Secondary | ICD-10-CM | POA: Diagnosis not present

## 2023-03-05 MED ORDER — LORATADINE 10 MG PO TABS
10.0000 mg | ORAL_TABLET | Freq: Every day | ORAL | 5 refills | Status: DC
  Filled 2023-03-05: qty 30, 30d supply, fill #0

## 2023-03-06 ENCOUNTER — Ambulatory Visit: Payer: 59

## 2023-03-06 ENCOUNTER — Other Ambulatory Visit: Payer: 59

## 2023-03-12 ENCOUNTER — Other Ambulatory Visit (HOSPITAL_COMMUNITY): Payer: Self-pay

## 2023-03-13 ENCOUNTER — Other Ambulatory Visit: Payer: Self-pay

## 2023-03-13 ENCOUNTER — Other Ambulatory Visit (HOSPITAL_COMMUNITY): Payer: Self-pay

## 2023-03-13 ENCOUNTER — Other Ambulatory Visit: Payer: Self-pay | Admitting: Pharmacist

## 2023-03-13 ENCOUNTER — Ambulatory Visit: Payer: 59 | Attending: Physician Assistant | Admitting: Pharmacist

## 2023-03-13 DIAGNOSIS — Z7189 Other specified counseling: Secondary | ICD-10-CM

## 2023-03-13 MED ORDER — COSENTYX UNOREADY 300 MG/2ML ~~LOC~~ SOAJ
SUBCUTANEOUS | 12 refills | Status: DC
  Filled 2023-03-13: qty 4, fill #0
  Filled 2023-03-14: qty 4, 28d supply, fill #0

## 2023-03-13 MED ORDER — COSENTYX UNOREADY 300 MG/2ML ~~LOC~~ SOAJ: SUBCUTANEOUS | 12 refills | Status: DC

## 2023-03-13 NOTE — Progress Notes (Signed)
   S: Patient presents today for review of their specialty medication.   Patient is currently prescribed Cosentyx (secukinumab) for psoriasis. Patient is managed by Dr. Selena Batten for this.   Per pt, she had tried other injectables in the past such as Skyrizi and Humira. Cosentyx is working okay for her.   Dosing: Plaque psoriasis: SubQ: 300 mg every 4 weeks.  Adherence: confirmed  Efficacy: reports that it seems to be working okay.   Current adverse effects: S/sx of infection: none GI upset:  none Headache: none S/sx of hypersensitivity: none  O: Monitored by Dr. Selena Batten per patient.   Lab Results  Component Value Date   WBC 11.1 (H) 11/13/2022   HGB 13.5 11/13/2022   HCT 42.4 11/13/2022   MCV 95.7 11/13/2022   PLT 241 11/13/2022     Chemistry      Component Value Date/Time   NA 139 11/13/2022 0351   NA 136 11/28/2012 0443   K 4.1 11/13/2022 0351   K 3.7 11/28/2012 0443   CL 102 11/13/2022 0351   CL 104 11/28/2012 0443   CO2 26 11/13/2022 0351   CO2 24 11/28/2012 0443   BUN 18 11/13/2022 0351   BUN 18 11/28/2012 0443   CREATININE 0.85 11/13/2022 0351   CREATININE 0.89 11/28/2012 0443      Component Value Date/Time   CALCIUM 9.2 11/13/2022 0351   CALCIUM 8.3 (L) 11/28/2012 0443     A/P: 1. Medication review: Patient is currently on Cosentyx for psoriasis. She feels comfortable with the medication given previous experience with other injectables. Reviewed the medication with the patient, including the following: Cosentyx is a monoclonal antibody used in the treatment of ankylosing spondylitis, psoriasis, and psoriatic arthritis. The injection is subq and the medication should be allowed to reach room temp prior to injecting. Injection sites should be rotated. Possible adverse effects include headaches, GI upset, increased risk of infection and hypersensitivity reactions. Recommend that she discuss efficacy with her specialist at her follow-up visit.   Butch Penny,  PharmD, Patsy [place], CPP Clinical Pharmacist [facility] [phone]

## 2023-03-14 ENCOUNTER — Other Ambulatory Visit (HOSPITAL_COMMUNITY): Payer: Self-pay

## 2023-03-19 ENCOUNTER — Other Ambulatory Visit: Payer: Self-pay

## 2023-03-19 DIAGNOSIS — L400 Psoriasis vulgaris: Secondary | ICD-10-CM | POA: Diagnosis not present

## 2023-03-19 MED ORDER — KETOCONAZOLE 2 % EX SHAM
MEDICATED_SHAMPOO | CUTANEOUS | 3 refills | Status: DC
  Filled 2023-03-19: qty 120, 30d supply, fill #0
  Filled 2023-05-23: qty 120, 30d supply, fill #1
  Filled 2023-06-18 – 2023-07-10 (×2): qty 120, 30d supply, fill #2
  Filled 2023-08-20: qty 120, 30d supply, fill #3
  Filled 2023-10-16: qty 120, 30d supply, fill #4
  Filled 2023-11-19: qty 120, 30d supply, fill #5
  Filled 2024-01-07: qty 120, 30d supply, fill #6
  Filled 2024-02-05: qty 120, 30d supply, fill #7

## 2023-03-19 MED ORDER — CLOBETASOL PROPIONATE 0.05 % EX SOLN
1.0000 | Freq: Two times a day (BID) | CUTANEOUS | 1 refills | Status: AC | PRN
  Filled 2023-03-19: qty 50, 30d supply, fill #0
  Filled 2024-01-07: qty 50, 30d supply, fill #1
  Filled 2024-02-05: qty 50, 30d supply, fill #2

## 2023-03-19 MED ORDER — CLOBETASOL PROPIONATE 0.05 % EX CREA
TOPICAL_CREAM | CUTANEOUS | 1 refills | Status: AC
  Filled 2023-03-19: qty 60, 30d supply, fill #0
  Filled 2023-05-23: qty 60, 30d supply, fill #1

## 2023-03-20 ENCOUNTER — Other Ambulatory Visit (HOSPITAL_COMMUNITY): Payer: Self-pay

## 2023-03-22 ENCOUNTER — Other Ambulatory Visit (HOSPITAL_COMMUNITY): Payer: Self-pay

## 2023-04-05 ENCOUNTER — Other Ambulatory Visit (HOSPITAL_COMMUNITY): Payer: Self-pay

## 2023-04-12 ENCOUNTER — Other Ambulatory Visit (HOSPITAL_COMMUNITY): Payer: Self-pay

## 2023-04-12 ENCOUNTER — Other Ambulatory Visit: Payer: Self-pay

## 2023-04-12 MED ORDER — BIMZELX 160 MG/ML ~~LOC~~ SOAJ: SUBCUTANEOUS | 4 refills | Status: DC

## 2023-04-13 ENCOUNTER — Other Ambulatory Visit (HOSPITAL_COMMUNITY): Payer: Self-pay

## 2023-04-13 DIAGNOSIS — M19011 Primary osteoarthritis, right shoulder: Secondary | ICD-10-CM | POA: Diagnosis not present

## 2023-04-13 DIAGNOSIS — M75111 Incomplete rotator cuff tear or rupture of right shoulder, not specified as traumatic: Secondary | ICD-10-CM | POA: Diagnosis not present

## 2023-04-13 DIAGNOSIS — M7581 Other shoulder lesions, right shoulder: Secondary | ICD-10-CM | POA: Diagnosis not present

## 2023-04-13 DIAGNOSIS — Z9889 Other specified postprocedural states: Secondary | ICD-10-CM | POA: Diagnosis not present

## 2023-04-16 ENCOUNTER — Other Ambulatory Visit (HOSPITAL_COMMUNITY): Payer: Self-pay

## 2023-04-16 ENCOUNTER — Telehealth: Payer: Self-pay | Admitting: Pharmacist

## 2023-04-16 NOTE — Telephone Encounter (Signed)
Called patient to schedule an appointment for the [employer] [facility]. I was unable to reach the patient so I left a HIPAA-compliant message requesting that the patient return my call.   Luke Van Ausdall, PharmD, BCACP, CPP Clinical Pharmacist [facility] [phone]  

## 2023-04-17 ENCOUNTER — Other Ambulatory Visit (HOSPITAL_COMMUNITY): Payer: Self-pay

## 2023-04-17 ENCOUNTER — Other Ambulatory Visit: Payer: Self-pay

## 2023-04-19 ENCOUNTER — Other Ambulatory Visit: Payer: Self-pay | Admitting: Surgery

## 2023-04-19 ENCOUNTER — Other Ambulatory Visit (HOSPITAL_COMMUNITY): Payer: Self-pay

## 2023-04-19 NOTE — Patient Instructions (Addendum)
Your procedure is scheduled on:04-24-23 Tuesday Report to the Registration Desk on the 1st floor of the [facility].Then proceed to the 2nd floor Surgery Desk To find out your arrival time, please call [phone] between 1PM - 3PM on:04-23-23 Monday If your arrival time is 6:00 am, do not arrive before that time as the [facility] entrance doors do not open until 6:00 am.  REMEMBER: Instructions that are not followed completely may result in serious medical risk, up to and including death; or upon the discretion of your surgeon and anesthesiologist your surgery may need to be rescheduled.  Do not eat food after midnight the night before surgery.  No gum chewing or hard candies.  You may however, drink CLEAR liquids up to 2 hours before you are scheduled to arrive for your surgery. Do not drink anything within 2 hours of your scheduled arrival time.  Clear liquids include: - water  - apple juice without pulp - gatorade (not RED colors) - black coffee or tea (Do NOT add milk or creamers to the coffee or tea) Do NOT drink anything that is not on this list.  In addition, your doctor has ordered for you to drink the provided:  Ensure Pre-Surgery Clear Carbohydrate Drink Drinking this carbohydrate drink up to two hours before surgery helps to reduce insulin resistance and improve patient outcomes. Please complete drinking 2 hours before scheduled arrival time.  One week prior to surgery: Stop Anti-inflammatories (NSAIDS) such as Advil, Aleve, Ibuprofen, Motrin, Naproxen, Naprosyn and Aspirin based products such as Excedrin, Goody's Powder, BC Powder.You may however, take Tylenol if needed for pain up until the day of surgery. Stop ANY OVER THE COUNTER supplements/vitamins NOW (04-20-23) until after surgery.   TAKE ONLY THESE MEDICATIONS THE MORNING OF SURGERY WITH A SIP OF WATER: -allopurinol (ZYLOPRIM)  -escitalopram (LEXAPRO)  -atorvastatin (LIPITOR)   Continue your  lisinopril-hydrochlorothiazide (ZESTORETIC) up until the day prior to surgery-Do NOT take the morning of your surgery  No Alcohol for 24 hours before or after surgery.  No Smoking including e-cigarettes for 24 hours before surgery.  No chewable tobacco products for at least 6 hours before surgery.  No nicotine patches on the day of surgery.  Do not use any "recreational" drugs for at least a week (preferably 2 weeks) before your surgery.  Please be advised that the combination of cocaine and anesthesia may have negative outcomes, up to and including death. If you test positive for cocaine, your surgery will be cancelled.  On the morning of surgery brush your teeth with toothpaste and water, you may rinse your mouth with mouthwash if you wish. Do not swallow any toothpaste or mouthwash.  Use CHG Soap as directed on instruction sheet.  Bring your C-Pap machine to the hospital  Do not wear jewelry, make-up, hairpins, clips or nail polish.  Do not wear lotions, powders, or perfumes.   Do not shave body hair from the neck down 48 hours before surgery.  Contact lenses, hearing aids and dentures may not be worn into surgery.  Do not bring valuables to the hospital. [facility] is not responsible for any missing/lost belongings or valuables.   Total Shoulder Arthroplasty:  use Benzoyl Peroxide 5% Gel as directed on instruction sheet.  Notify your doctor if there is any change in your medical condition (cold, fever, infection).  Wear comfortable clothing (specific to your surgery type) to the hospital.  After surgery, you can help prevent lung complications by doing breathing exercises.  Take deep breaths and cough every 1-2 hours. Your doctor may order a device called an Incentive Spirometer to help you take deep breaths. When coughing or sneezing, hold a pillow firmly against your incision with both hands. This is called "splinting." Doing this helps protect your incision. It also  decreases belly discomfort.  If you are being admitted to the hospital overnight, leave your suitcase in the car. After surgery it may be brought to your room.  In case of increased patient census, it may be necessary for you, the patient, to continue your postoperative care in the Same Day Surgery department.  If you are being discharged the day of surgery, you will not be allowed to drive home. You will need a responsible individual to drive you home and stay with you for 24 hours after surgery.   If you are taking public transportation, you will need to have a responsible individual with you.  Please call the Pre-admissions Testing Dept. at [phone] if you have any questions about these instructions.  Surgery Visitation Policy:  Patients having surgery or a procedure may have two visitors.  Children under the age of 37 must have an adult with them who is not the patient.  Inpatient Visitation:    Visiting hours are 7 a.m. to 8 p.m. Up to four visitors are allowed at one time in a patient room. The visitors may rotate out with other people during the day.  One visitor age 67 or older may stay with the patient overnight and must be in the room by 8 p.m.    Pre-operative 5 CHG Bath Instructions   You can play a key role in reducing the risk of infection after surgery. Your skin needs to be as free of germs as possible. You can reduce the number of germs on your skin by washing with CHG (chlorhexidine gluconate) soap before surgery. CHG is an antiseptic soap that kills germs and continues to kill germs even after washing.   DO NOT use if you have an allergy to chlorhexidine/CHG or antibacterial soaps. If your skin becomes reddened or irritated, stop using the CHG and notify one of our RNs at [phone].   Please shower with the CHG soap starting 4 days before surgery using the following schedule:     Please keep in mind the following:  DO NOT shave, including legs and  underarms, starting the day of your first shower.   You may shave your face at any point before/day of surgery.  Place clean sheets on your bed the day you start using CHG soap. Use a clean washcloth (not used since being washed) for each shower. DO NOT sleep with pets once you start using the CHG.   CHG Shower Instructions:  If you choose to wash your hair and private area, wash first with your normal shampoo/soap.  After you use shampoo/soap, rinse your hair and body thoroughly to remove shampoo/soap residue.  Turn the water OFF and apply about 3 tablespoons (45 ml) of CHG soap to a CLEAN washcloth.  Apply CHG soap ONLY FROM YOUR NECK DOWN TO YOUR TOES (washing for 3-5 minutes)  DO NOT use CHG soap on face, private areas, open wounds, or sores.  Pay special attention to the area where your surgery is being performed.  If you are having back surgery, having someone wash your back for you may be helpful. Wait 2 minutes after CHG soap is applied, then you may rinse off the CHG soap.  Pat dry with a clean towel  Put on clean clothes/pajamas   If you choose to wear lotion, please use ONLY the CHG-compatible lotions on the back of this paper.     Additional instructions for the day of surgery: DO NOT APPLY any lotions, deodorants, cologne, or perfumes.   Put on clean/comfortable clothes.  Brush your teeth.  Ask your nurse before applying any prescription medications to the skin.      CHG Compatible Lotions   Aveeno Moisturizing lotion  Cetaphil Moisturizing Cream  Cetaphil Moisturizing Lotion  Clairol Herbal Essence Moisturizing Lotion, Dry Skin  Clairol Herbal Essence Moisturizing Lotion, Extra Dry Skin  Clairol Herbal Essence Moisturizing Lotion, Normal Skin  Curel Age Defying Therapeutic Moisturizing Lotion with Alpha Hydroxy  Curel Extreme Care Body Lotion  Curel Soothing Hands Moisturizing Hand Lotion  Curel Therapeutic Moisturizing Cream, Fragrance-Free  Curel Therapeutic  Moisturizing Lotion, Fragrance-Free  Curel Therapeutic Moisturizing Lotion, Original Formula  Eucerin Daily Replenishing Lotion  Eucerin Dry Skin Therapy Plus Alpha Hydroxy Crme  Eucerin Dry Skin Therapy Plus Alpha Hydroxy Lotion  Eucerin Original Crme  Eucerin Original Lotion  Eucerin Plus Crme Eucerin Plus Lotion  Eucerin TriLipid Replenishing Lotion  Keri Anti-Bacterial Hand Lotion  Keri Deep Conditioning Original Lotion Dry Skin Formula Softly Scented  Keri Deep Conditioning Original Lotion, Fragrance Free Sensitive Skin Formula  Keri Lotion Fast Absorbing Fragrance Free Sensitive Skin Formula  Keri Lotion Fast Absorbing Softly Scented Dry Skin Formula  Keri Original Lotion  Keri Skin Renewal Lotion Keri Silky Smooth Lotion  Keri Silky Smooth Sensitive Skin Lotion  Nivea Body Creamy Conditioning Oil  Nivea Body Extra Enriched Lotion  Nivea Body Original Lotion  Nivea Body Sheer Moisturizing Lotion Nivea Crme  Nivea Skin Firming Lotion  NutraDerm 30 Skin Lotion  NutraDerm Skin Lotion  NutraDerm Therapeutic Skin Cream  NutraDerm Therapeutic Skin Lotion  ProShield Protective Hand Cream  Provon moisturizing lotion  Preparing for Total Shoulder Arthroplasty  Before surgery, you can play an important role by reducing the number of germs on your skin by using the following products:  Benzoyl Peroxide Gel  o Reduces the number of germs present on the skin  o Applied twice a day to shoulder area starting two days before surgery  Chlorhexidine Gluconate (CHG) Soap  o An antiseptic cleaner that kills germs and bonds with the skin to continue killing germs even after washing  o Used for showering the night before surgery and morning of surgery  BENZOYL PEROXIDE 5% GEL  Please do not use if you have an allergy to benzoyl peroxide. If your skin becomes reddened/irritated stop using the benzoyl peroxide.  Starting two days before surgery, apply as follows:  1. Apply  benzoyl peroxide in the morning and at night. Apply after taking a shower. If you are not taking a shower, clean entire shoulder front, back, and side along with the armpit with a clean wet washcloth.  2. Place a quarter-sized dollop on your shoulder and rub in thoroughly, making sure to cover the front, back, and side of your shoulder, along with the armpit.  2 days before ____ AM ____ PM 1 day before ____ AM ____ PM  3. Do this twice a day for two days. (Last application is the night before surgery, AFTER using the CHG soap).  4. Do NOT apply benzoyl peroxide gel on the day of surgery.  How to Use an Incentive Spirometer An incentive spirometer is a tool that measures how well you  are filling your lungs with each breath. Learning to take long, deep breaths using this tool can help you keep your lungs clear and active. This may help to reverse or lessen your chance of developing breathing (pulmonary) problems, especially infection. You may be asked to use a spirometer: After a surgery. If you have a lung problem or a history of smoking. After a long period of time when you have been unable to move or be active. If the spirometer includes an indicator to show the highest number that you have reached, your health care provider or respiratory therapist will help you set a goal. Keep a log of your progress as told by your health care provider. What are the risks? Breathing too quickly may cause dizziness or cause you to pass out. Take your time so you do not get dizzy or light-headed. If you are in pain, you may need to take pain medicine before doing incentive spirometry. It is harder to take a deep breath if you are having pain. How to use your incentive spirometer  Sit up on the edge of your bed or on a chair. Hold the incentive spirometer so that it is in an upright position. Before you use the spirometer, breathe out normally. Place the mouthpiece in your mouth. Make sure your lips are  closed tightly around it. Breathe in slowly and as deeply as you can through your mouth, causing the piston or the ball to rise toward the top of the chamber. Hold your breath for 3-5 seconds, or for as long as possible. If the spirometer includes a coach indicator, use this to guide you in breathing. Slow down your breathing if the indicator goes above the marked areas. Remove the mouthpiece from your mouth and breathe out normally. The piston or ball will return to the bottom of the chamber. Rest for a few seconds, then repeat the steps 10 or more times. Take your time and take a few normal breaths between deep breaths so that you do not get dizzy or light-headed. Do this every 1-2 hours when you are awake. If the spirometer includes a goal marker to show the highest number you have reached (best effort), use this as a goal to work toward during each repetition. After each set of 10 deep breaths, cough a few times. This will help to make sure that your lungs are clear. If you have an incision on your chest or abdomen from surgery, place a pillow or a rolled-up towel firmly against the incision when you cough. This can help to reduce pain while taking deep breaths and coughing. General tips When you are able to get out of bed: Walk around often. Continue to take deep breaths and cough in order to clear your lungs. Keep using the incentive spirometer until your health care provider says it is okay to stop using it. If you have been in the hospital, you may be told to keep using the spirometer at home. Contact a health care provider if: You are having difficulty using the spirometer. You have trouble using the spirometer as often as instructed. Your pain medicine is not giving enough relief for you to use the spirometer as told. You have a fever. Get help right away if: You develop shortness of breath. You develop a cough with bloody mucus from the lungs. You have fluid or blood coming from an  incision site after you cough. Summary An incentive spirometer is a tool that can help you learn  to take long, deep breaths to keep your lungs clear and active. You may be asked to use a spirometer after a surgery, if you have a lung problem or a history of smoking, or if you have been inactive for a long period of time. Use your incentive spirometer as instructed every 1-2 hours while you are awake. If you have an incision on your chest or abdomen, place a pillow or a rolled-up towel firmly against your incision when you cough. This will help to reduce pain. Get help right away if you have shortness of breath, you cough up bloody mucus, or blood comes from your incision when you cough. This information is not intended to replace advice given to you by your health care provider. Make sure you discuss any questions you have with your health care provider. Document Revised: 02/09/2020 Document Reviewed: 02/09/2020 Elsevier Patient Education  2023 [employer].

## 2023-04-20 ENCOUNTER — Other Ambulatory Visit: Payer: Self-pay | Admitting: Surgery

## 2023-04-20 ENCOUNTER — Encounter
Admission: RE | Admit: 2023-04-20 | Discharge: 2023-04-20 | Disposition: A | Discharge status: Home / Self Care | Payer: 59 | Source: Ambulatory Visit | Attending: Surgery | Admitting: Surgery

## 2023-04-20 VITALS — BP 160/63 | HR 70 | Resp 14 | Ht 66.0 in | Wt 248.2 lb

## 2023-04-20 DIAGNOSIS — Z01818 Encounter for other preprocedural examination: Secondary | ICD-10-CM

## 2023-04-20 DIAGNOSIS — Z01812 Encounter for preprocedural laboratory examination: Secondary | ICD-10-CM | POA: Insufficient documentation

## 2023-04-20 HISTORY — DX: Anxiety disorder, unspecified: F41.9

## 2023-04-20 HISTORY — DX: Gout, unspecified: M10.9

## 2023-04-20 HISTORY — DX: Prediabetes: R73.03

## 2023-04-20 HISTORY — DX: Obstructive sleep apnea (adult) (pediatric): G47.33

## 2023-04-20 LAB — COMPREHENSIVE METABOLIC PANEL
ALT: 36 U/L (ref 0–44)
AST: 39 U/L (ref 15–41)
Albumin: 4.1 g/dL (ref 3.5–5.0)
Alkaline Phosphatase: 50 U/L (ref 38–126)
Anion gap: 10 (ref 5–15)
BUN: 26 mg/dL — ABNORMAL HIGH (ref 6–20)
CO2: 25 mmol/L (ref 22–32)
Calcium: 9.4 mg/dL (ref 8.9–10.3)
Chloride: 106 mmol/L (ref 98–111)
Creatinine, Ser: 1.14 mg/dL — ABNORMAL HIGH (ref 0.44–1.00)
GFR, Estimated: 55 mL/min — ABNORMAL LOW (ref >60)
Glucose, Bld: 126 mg/dL — ABNORMAL HIGH (ref 70–99)
Potassium: 4 mmol/L (ref 3.5–5.1)
Sodium: 141 mmol/L (ref 135–145)
Total Bilirubin: 0.7 mg/dL (ref 0.3–1.2)
Total Protein: 7.9 g/dL (ref 6.5–8.1)

## 2023-04-20 LAB — URINALYSIS, ROUTINE W REFLEX MICROSCOPIC
Bilirubin Urine: NEGATIVE
Glucose, UA: NEGATIVE mg/dL
Hgb urine dipstick: NEGATIVE
Ketones, ur: NEGATIVE mg/dL
Nitrite: NEGATIVE
Protein, ur: NEGATIVE mg/dL
Specific Gravity, Urine: 1.019 (ref 1.005–1.030)
pH: 5 (ref 5.0–8.0)

## 2023-04-20 LAB — SURGICAL PCR SCREEN
MRSA, PCR: NEGATIVE
Staphylococcus aureus: POSITIVE — AB

## 2023-04-20 LAB — TYPE AND SCREEN
ABO/RH(D): A POS
Antibody Screen: NEGATIVE

## 2023-04-23 ENCOUNTER — Other Ambulatory Visit: Payer: Self-pay

## 2023-04-23 ENCOUNTER — Other Ambulatory Visit (HOSPITAL_COMMUNITY): Payer: Self-pay

## 2023-04-23 MED ORDER — CHLORHEXIDINE GLUCONATE 0.12 % MT SOLN
15.0000 mL | Freq: Once | OROMUCOSAL | Status: AC
  Administered 2023-04-24: 15 mL via OROMUCOSAL

## 2023-04-23 MED ORDER — ORAL CARE MOUTH RINSE: 15.0000 mL | Freq: Once | OROMUCOSAL | Status: AC

## 2023-04-23 MED ORDER — CEFAZOLIN SODIUM-DEXTROSE 2-4 GM/100ML-% IV SOLN
2.0000 g | INTRAVENOUS | Status: AC
  Administered 2023-04-24: 2 g via INTRAVENOUS

## 2023-04-23 MED ORDER — FAMOTIDINE 20 MG PO TABS
20.0000 mg | ORAL_TABLET | Freq: Once | ORAL | Status: AC
  Administered 2023-04-24: 20 mg via ORAL

## 2023-04-23 MED ORDER — LACTATED RINGERS IV SOLN: INTRAVENOUS | Status: DC

## 2023-04-24 ENCOUNTER — Encounter: Payer: Self-pay | Admitting: Surgery

## 2023-04-24 ENCOUNTER — Ambulatory Visit: Payer: 59

## 2023-04-24 ENCOUNTER — Ambulatory Visit: Payer: 59 | Admitting: Urgent Care

## 2023-04-24 ENCOUNTER — Other Ambulatory Visit: Payer: Self-pay

## 2023-04-24 ENCOUNTER — Ambulatory Visit
Admission: RE | Admit: 2023-04-24 | Discharge: 2023-04-24 | Disposition: A | Discharge status: Home / Self Care | Payer: 59 | Attending: Surgery | Admitting: Surgery

## 2023-04-24 ENCOUNTER — Encounter
Admission: RE | Disposition: A | Discharge status: Home / Self Care | Payer: Self-pay | Source: Home / Self Care | Attending: Surgery

## 2023-04-24 ENCOUNTER — Ambulatory Visit: Payer: 59 | Admitting: General Practice

## 2023-04-24 DIAGNOSIS — R7303 Prediabetes: Secondary | ICD-10-CM | POA: Insufficient documentation

## 2023-04-24 DIAGNOSIS — Z96611 Presence of right artificial shoulder joint: Secondary | ICD-10-CM | POA: Diagnosis not present

## 2023-04-24 DIAGNOSIS — Z9889 Other specified postprocedural states: Secondary | ICD-10-CM | POA: Insufficient documentation

## 2023-04-24 DIAGNOSIS — F419 Anxiety disorder, unspecified: Secondary | ICD-10-CM | POA: Diagnosis not present

## 2023-04-24 DIAGNOSIS — M109 Gout, unspecified: Secondary | ICD-10-CM

## 2023-04-24 DIAGNOSIS — Z471 Aftercare following joint replacement surgery: Secondary | ICD-10-CM | POA: Diagnosis not present

## 2023-04-24 DIAGNOSIS — E785 Hyperlipidemia, unspecified: Secondary | ICD-10-CM | POA: Diagnosis not present

## 2023-04-24 DIAGNOSIS — G8918 Other acute postprocedural pain: Secondary | ICD-10-CM | POA: Diagnosis not present

## 2023-04-24 DIAGNOSIS — I10 Essential (primary) hypertension: Secondary | ICD-10-CM | POA: Diagnosis not present

## 2023-04-24 DIAGNOSIS — M19011 Primary osteoarthritis, right shoulder: Secondary | ICD-10-CM | POA: Insufficient documentation

## 2023-04-24 DIAGNOSIS — M7581 Other shoulder lesions, right shoulder: Secondary | ICD-10-CM | POA: Diagnosis not present

## 2023-04-24 DIAGNOSIS — M75111 Incomplete rotator cuff tear or rupture of right shoulder, not specified as traumatic: Secondary | ICD-10-CM | POA: Diagnosis not present

## 2023-04-24 HISTORY — PX: REVERSE SHOULDER ARTHROPLASTY: SHX5054

## 2023-04-24 LAB — ABO/RH: ABO/RH(D): A POS

## 2023-04-24 SURGERY — ARTHROPLASTY, SHOULDER, TOTAL, REVERSE
Anesthesia: General | Site: Shoulder | Laterality: Right

## 2023-04-24 MED ORDER — PROPOFOL 500 MG/50ML IV EMUL
INTRAVENOUS | Status: DC | PRN
  Administered 2023-04-24: 20 ug/kg/min via INTRAVENOUS

## 2023-04-24 MED ORDER — SODIUM CHLORIDE 0.9 % IR SOLN
Status: DC | PRN
  Administered 2023-04-24: 3000 mL

## 2023-04-24 MED ORDER — PHENYLEPHRINE HCL-NACL 20-0.9 MG/250ML-% IV SOLN
INTRAVENOUS | Status: DC | PRN
  Administered 2023-04-24: 35 ug/min via INTRAVENOUS

## 2023-04-24 MED ORDER — ROCURONIUM BROMIDE 100 MG/10ML IV SOLN
INTRAVENOUS | Status: DC | PRN
  Administered 2023-04-24: 10 mg via INTRAVENOUS
  Administered 2023-04-24: 50 mg via INTRAVENOUS

## 2023-04-24 MED ORDER — BIMZELX 160 MG/ML ~~LOC~~ SOAJ: 1.0000 | SUBCUTANEOUS | Status: DC

## 2023-04-24 MED ORDER — ACETAMINOPHEN 325 MG PO TABS: 325.0000 mg | ORAL_TABLET | Freq: Four times a day (QID) | ORAL | Status: DC | PRN

## 2023-04-24 MED ORDER — DEXAMETHASONE SODIUM PHOSPHATE 10 MG/ML IJ SOLN
INTRAMUSCULAR | Status: DC | PRN
  Administered 2023-04-24: 5 mg via INTRAVENOUS

## 2023-04-24 MED ORDER — CEFAZOLIN SODIUM-DEXTROSE 2-4 GM/100ML-% IV SOLN
INTRAVENOUS | Status: AC
  Filled 2023-04-24: qty 100

## 2023-04-24 MED ORDER — MIDAZOLAM HCL 2 MG/2ML IJ SOLN
INTRAMUSCULAR | Status: AC
  Filled 2023-04-24: qty 2

## 2023-04-24 MED ORDER — FENTANYL CITRATE PF 50 MCG/ML IJ SOSY
PREFILLED_SYRINGE | INTRAMUSCULAR | Status: AC
  Filled 2023-04-24: qty 1

## 2023-04-24 MED ORDER — CLOBETASOL PROPIONATE 0.05 % EX CREA: 1.0000 | TOPICAL_CREAM | CUTANEOUS | Status: AC | PRN

## 2023-04-24 MED ORDER — METOCLOPRAMIDE HCL 10 MG PO TABS: 5.0000 mg | ORAL_TABLET | Freq: Three times a day (TID) | ORAL | Status: DC | PRN

## 2023-04-24 MED ORDER — BUPIVACAINE LIPOSOME 1.3 % IJ SUSP
INTRAMUSCULAR | Status: DC | PRN
  Administered 2023-04-24: 20 mL

## 2023-04-24 MED ORDER — FENTANYL CITRATE (PF) 100 MCG/2ML IJ SOLN
INTRAMUSCULAR | Status: DC | PRN
  Administered 2023-04-24 (×2): 50 ug via INTRAVENOUS

## 2023-04-24 MED ORDER — SODIUM CHLORIDE FLUSH 0.9 % IV SOLN
INTRAVENOUS | Status: AC
  Filled 2023-04-24: qty 20

## 2023-04-24 MED ORDER — HYDROCODONE-ACETAMINOPHEN 5-325 MG PO TABS: 1.0000 | ORAL_TABLET | Freq: Four times a day (QID) | ORAL | 0 refills | Status: AC | PRN

## 2023-04-24 MED ORDER — METOCLOPRAMIDE HCL 5 MG/ML IJ SOLN: 5.0000 mg | Freq: Three times a day (TID) | INTRAMUSCULAR | Status: DC | PRN

## 2023-04-24 MED ORDER — ONDANSETRON HCL 4 MG PO TABS: 4.0000 mg | ORAL_TABLET | Freq: Four times a day (QID) | ORAL | Status: DC | PRN

## 2023-04-24 MED ORDER — EPINEPHRINE PF 1 MG/ML IJ SOLN
INTRAMUSCULAR | Status: AC
  Filled 2023-04-24: qty 1

## 2023-04-24 MED ORDER — KETOROLAC TROMETHAMINE 30 MG/ML IJ SOLN
30.0000 mg | Freq: Once | INTRAMUSCULAR | Status: AC
  Administered 2023-04-24: 30 mg via INTRAVENOUS

## 2023-04-24 MED ORDER — TRANEXAMIC ACID 1000 MG/10ML IV SOLN
INTRAVENOUS | Status: AC
  Filled 2023-04-24: qty 10

## 2023-04-24 MED ORDER — BUPIVACAINE HCL (PF) 0.5 % IJ SOLN
INTRAMUSCULAR | Status: AC
  Filled 2023-04-24: qty 30

## 2023-04-24 MED ORDER — COLCHICINE 0.6 MG PO TABS: 1.2000 mg | ORAL_TABLET | ORAL | Status: AC | PRN

## 2023-04-24 MED ORDER — SODIUM CHLORIDE 0.9 % IV SOLN: INTRAVENOUS | Status: DC

## 2023-04-24 MED ORDER — CHLORHEXIDINE GLUCONATE 0.12 % MT SOLN
OROMUCOSAL | Status: AC
  Filled 2023-04-24: qty 15

## 2023-04-24 MED ORDER — BUPIVACAINE HCL (PF) 0.5 % IJ SOLN
INTRAMUSCULAR | Status: DC | PRN
  Administered 2023-04-24: 10 mL

## 2023-04-24 MED ORDER — ONDANSETRON HCL 4 MG/2ML IJ SOLN
INTRAMUSCULAR | Status: DC | PRN
  Administered 2023-04-24: 4 mg via INTRAVENOUS

## 2023-04-24 MED ORDER — FENTANYL CITRATE (PF) 100 MCG/2ML IJ SOLN
INTRAMUSCULAR | Status: AC
  Filled 2023-04-24: qty 2

## 2023-04-24 MED ORDER — CEFAZOLIN SODIUM-DEXTROSE 2-4 GM/100ML-% IV SOLN
2.0000 g | Freq: Four times a day (QID) | INTRAVENOUS | Status: DC
  Administered 2023-04-24: 2 g via INTRAVENOUS

## 2023-04-24 MED ORDER — ATORVASTATIN CALCIUM 20 MG PO TABS: 20.0000 mg | ORAL_TABLET | ORAL | Status: AC

## 2023-04-24 MED ORDER — PHENYLEPHRINE HCL-NACL 20-0.9 MG/250ML-% IV SOLN
INTRAVENOUS | Status: AC
  Filled 2023-04-24: qty 250

## 2023-04-24 MED ORDER — BUPIVACAINE LIPOSOME 1.3 % IJ SUSP
INTRAMUSCULAR | Status: AC
  Filled 2023-04-24: qty 20

## 2023-04-24 MED ORDER — ALLOPURINOL 100 MG PO TABS: 100.0000 mg | ORAL_TABLET | ORAL | Status: AC

## 2023-04-24 MED ORDER — HYDROCODONE-ACETAMINOPHEN 5-325 MG PO TABS: 1.0000 | ORAL_TABLET | ORAL | Status: DC | PRN

## 2023-04-24 MED ORDER — DEXAMETHASONE SODIUM PHOSPHATE 10 MG/ML IJ SOLN
INTRAMUSCULAR | Status: AC
  Filled 2023-04-24: qty 1

## 2023-04-24 MED ORDER — PROPOFOL 10 MG/ML IV BOLUS
INTRAVENOUS | Status: DC | PRN
  Administered 2023-04-24: 150 mg via INTRAVENOUS

## 2023-04-24 MED ORDER — ONDANSETRON HCL 4 MG/2ML IJ SOLN
INTRAMUSCULAR | Status: AC
  Filled 2023-04-24: qty 2

## 2023-04-24 MED ORDER — FENTANYL CITRATE PF 50 MCG/ML IJ SOSY
50.0000 ug | PREFILLED_SYRINGE | Freq: Once | INTRAMUSCULAR | Status: AC
  Administered 2023-04-24: 50 ug via INTRAVENOUS

## 2023-04-24 MED ORDER — BUPIVACAINE-EPINEPHRINE (PF) 0.5% -1:200000 IJ SOLN
INTRAMUSCULAR | Status: DC | PRN
  Administered 2023-04-24: 30 mL

## 2023-04-24 MED ORDER — FAMOTIDINE 20 MG PO TABS
ORAL_TABLET | ORAL | Status: AC
  Filled 2023-04-24: qty 1

## 2023-04-24 MED ORDER — KETOROLAC TROMETHAMINE 30 MG/ML IJ SOLN
INTRAMUSCULAR | Status: AC
  Filled 2023-04-24: qty 1

## 2023-04-24 MED ORDER — ESCITALOPRAM OXALATE 10 MG PO TABS: 10.0000 mg | ORAL_TABLET | ORAL | Status: AC

## 2023-04-24 MED ORDER — FENTANYL CITRATE (PF) 100 MCG/2ML IJ SOLN: 25.0000 ug | INTRAMUSCULAR | Status: DC | PRN

## 2023-04-24 MED ORDER — DROPERIDOL 2.5 MG/ML IJ SOLN: 0.6250 mg | Freq: Once | INTRAMUSCULAR | Status: DC | PRN

## 2023-04-24 MED ORDER — EPHEDRINE SULFATE (PRESSORS) 50 MG/ML IJ SOLN
INTRAMUSCULAR | Status: DC | PRN
  Administered 2023-04-24: 5 mg via INTRAVENOUS
  Administered 2023-04-24: 10 mg via INTRAVENOUS
  Administered 2023-04-24: 5 mg via INTRAVENOUS

## 2023-04-24 MED ORDER — BUPIVACAINE LIPOSOME 1.3 % IJ SUSP
INTRAMUSCULAR | Status: AC
  Filled 2023-04-24: qty 10

## 2023-04-24 MED ORDER — BUPIVACAINE HCL (PF) 0.5 % IJ SOLN
INTRAMUSCULAR | Status: AC
  Filled 2023-04-24: qty 10

## 2023-04-24 MED ORDER — TRANEXAMIC ACID 1000 MG/10ML IV SOLN
INTRAVENOUS | Status: DC | PRN
  Administered 2023-04-24: 1000 mg via TOPICAL

## 2023-04-24 MED ORDER — MIDAZOLAM HCL 2 MG/2ML IJ SOLN
1.0000 mg | INTRAMUSCULAR | Status: DC | PRN
  Administered 2023-04-24: 1 mg via INTRAVENOUS

## 2023-04-24 MED ORDER — 0.9 % SODIUM CHLORIDE (POUR BTL) OPTIME
TOPICAL | Status: DC | PRN
  Administered 2023-04-24: 500 mL

## 2023-04-24 MED ORDER — HYDROCODONE-ACETAMINOPHEN 5-325 MG PO TABS
1.0000 | ORAL_TABLET | Freq: Four times a day (QID) | ORAL | 0 refills | Status: AC | PRN
  Filled 2023-04-24: qty 30, 4d supply, fill #0

## 2023-04-24 MED ORDER — ONDANSETRON HCL 4 MG/2ML IJ SOLN: 4.0000 mg | Freq: Four times a day (QID) | INTRAMUSCULAR | Status: DC | PRN

## 2023-04-24 MED ORDER — SUGAMMADEX SODIUM 200 MG/2ML IV SOLN
INTRAVENOUS | Status: DC | PRN
  Administered 2023-04-24: 200 mg via INTRAVENOUS

## 2023-04-24 SURGICAL SUPPLY — 70 items
APL PRP STRL LF DISP 70% ISPRP (MISCELLANEOUS) ×1
BIT DRILL FLUTED 3.0 STRL (BIT) IMPLANT
BLADE SAW SAG 25X90X1.19 (BLADE) ×1 IMPLANT
BSPLAT GLND +2X24 MDLR (Joint) ×1 IMPLANT
CHLORAPREP W/TINT 26 (MISCELLANEOUS) ×1 IMPLANT
COMP HUM GLENOID RSA 33 +3 (Shoulder) ×1 IMPLANT
COMPONENT HUM GLENOD RSA 33 +3 (Shoulder) IMPLANT
COOLER POLAR GLACIER W/PUMP (MISCELLANEOUS) ×1 IMPLANT
COVER BACK TABLE REUSABLE LG (DRAPES) ×1 IMPLANT
CUP SUT UNIV REV NEUTRAL 33 (Cup) IMPLANT
DRAPE 3/4 80X56 (DRAPES) ×1 IMPLANT
DRAPE INCISE IOBAN 66X45 STRL (DRAPES) ×1 IMPLANT
DRSG OPSITE POSTOP 4X8 (GAUZE/BANDAGES/DRESSINGS) ×1 IMPLANT
ELECT BLADE 6.5 EXT (BLADE) IMPLANT
ELECT CAUTERY BLADE 6.4 (BLADE) ×1 IMPLANT
ELECT REM PT RETURN 9FT ADLT (ELECTROSURGICAL) ×1
ELECTRODE REM PT RTRN 9FT ADLT (ELECTROSURGICAL) ×1 IMPLANT
GAUZE XEROFORM 1X8 LF (GAUZE/BANDAGES/DRESSINGS) ×1 IMPLANT
GLENOID UNI REV MOD 24 +2 LAT (Joint) IMPLANT
GLENOSPHERE 36 +4 LAT/24 (Joint) IMPLANT
GLOVE BIO SURGEON STRL SZ7.5 (GLOVE) ×4 IMPLANT
GLOVE BIO SURGEON STRL SZ8 (GLOVE) ×4 IMPLANT
GLOVE BIOGEL PI IND STRL 8 (GLOVE) ×2 IMPLANT
GLOVE INDICATOR 8.0 STRL GRN (GLOVE) ×1 IMPLANT
GOWN STRL REUS W/ TWL LRG LVL3 (GOWN DISPOSABLE) ×1 IMPLANT
GOWN STRL REUS W/ TWL XL LVL3 (GOWN DISPOSABLE) ×1 IMPLANT
GOWN STRL REUS W/TWL LRG LVL3 (GOWN DISPOSABLE) ×1
GOWN STRL REUS W/TWL XL LVL3 (GOWN DISPOSABLE) ×1
HANDLE YANKAUER SUCT OPEN TIP (MISCELLANEOUS) ×1 IMPLANT
HOOD PEEL AWAY T7 (MISCELLANEOUS) ×3 IMPLANT
IV NS IRRIG 3000ML ARTHROMATIC (IV SOLUTION) ×1 IMPLANT
KIT STABILIZATION SHOULDER (MISCELLANEOUS) ×1 IMPLANT
KIT TURNOVER KIT A (KITS) ×1 IMPLANT
MANIFOLD NEPTUNE II (INSTRUMENTS) ×1 IMPLANT
MASK FACE SPIDER DISP (MASK) ×1 IMPLANT
MAT ABSORB  FLUID 56X50 GRAY (MISCELLANEOUS) ×1
MAT ABSORB FLUID 56X50 GRAY (MISCELLANEOUS) ×1 IMPLANT
NDL MAYO CATGUT SZ1 (NEEDLE) IMPLANT
NDL SAFETY ECLIP 18X1.5 (MISCELLANEOUS) ×1 IMPLANT
NDL SPNL 20GX3.5 QUINCKE YW (NEEDLE) ×1 IMPLANT
NEEDLE MAYO CATGUT SZ1 (NEEDLE) IMPLANT
NEEDLE SPNL 20GX3.5 QUINCKE YW (NEEDLE) ×1 IMPLANT
NS IRRIG 500ML POUR BTL (IV SOLUTION) ×1 IMPLANT
PACK ARTHROSCOPY SHOULDER (MISCELLANEOUS) ×1 IMPLANT
PAD ARMBOARD 7.5X6 YLW CONV (MISCELLANEOUS) ×1 IMPLANT
PAD WRAPON POLAR SHDR UNIV (MISCELLANEOUS) ×1 IMPLANT
PIN NITINOL TARGETER 2.8 (PIN) IMPLANT
PULSAVAC PLUS IRRIG FAN TIP (DISPOSABLE) ×1
SCREW CENTRAL MODULAR 20 (Screw) IMPLANT
SCREW PERI LOCK 5.5X16 (Screw) IMPLANT
SCREW PERIPHERAL 5.5X20 LOCK (Screw) IMPLANT
SCREW PERIPHERAL 5.5X28 LOCK (Screw) IMPLANT
SLING ULTRA II LG (MISCELLANEOUS) IMPLANT
SLING ULTRA II M (MISCELLANEOUS) IMPLANT
SPONGE T-LAP 18X18 ~~LOC~~+RFID (SPONGE) ×2 IMPLANT
STAPLER SKIN PROX 35W (STAPLE) ×1 IMPLANT
STEM HUM UNIV SHLD 8 (Stem) IMPLANT
SUT ETHIBOND 0 MO6 C/R (SUTURE) ×1 IMPLANT
SUT FIBERWIRE #2 38 BLUE 1/2 (SUTURE) ×5
SUT VIC AB 0 CT1 36 (SUTURE) ×1 IMPLANT
SUT VIC AB 2-0 CT1 27 (SUTURE) ×2
SUT VIC AB 2-0 CT1 TAPERPNT 27 (SUTURE) ×2 IMPLANT
SUTURE FIBERWR #2 38 BLUE 1/2 (SUTURE) ×4 IMPLANT
SYR 10ML LL (SYRINGE) ×1 IMPLANT
SYR 30ML LL (SYRINGE) ×1 IMPLANT
SYR TOOMEY 50ML (SYRINGE) ×1 IMPLANT
TIP FAN IRRIG PULSAVAC PLUS (DISPOSABLE) ×1 IMPLANT
TRAP FLUID SMOKE EVACUATOR (MISCELLANEOUS) ×1 IMPLANT
WATER STERILE IRR 500ML POUR (IV SOLUTION) ×1 IMPLANT
WRAPON POLAR PAD SHDR UNIV (MISCELLANEOUS) ×1

## 2023-04-24 NOTE — Discharge Instructions (Addendum)
Orthopedic discharge instructions: May shower with intact OpSite dressing once nerve block has worn off (Saturday).  Apply ice frequently to shoulder. Take ibuprofen 600 mg TID with meals for 5-7 days, then as necessary. Take pain medication as prescribed when needed.  May supplement with ES Tylenol if necessary. Keep shoulder immobilizer on at all times except may remove for bathing purposes. Follow-up in 10-14 days or as scheduled.    AMBULATORY SURGERY  DISCHARGE INSTRUCTIONS   The drugs that you were given will stay in your system until tomorrow so for the next 24 hours you should not:  Drive an automobile Make any legal decisions Drink any alcoholic beverage   You may resume regular meals tomorrow.  Today it is better to start with liquids and gradually work up to solid foods.  You may eat anything you prefer, but it is better to start with liquids, then soup and crackers, and gradually work up to solid foods.   Please notify your doctor immediately if you have any unusual bleeding, trouble breathing, redness and pain at the surgery site, drainage, fever, or pain not relieved by medication.   Additional Instructions:  leave green armband on for 4 days   POLAR CARE INFORMATION  MassAdvertisement.it  How to use [facility] Polar Care [facility] Therapy System?  YouTube   ShippingScam.co.uk  OPERATING INSTRUCTIONS  Start the product With dry hands, connect the transformer to the electrical connection located on the top of the cooler. Next, plug the transformer into an appropriate electrical outlet. The unit will automatically start running at this point.  To stop the pump, disconnect electrical power.  Unplug to stop the product when not in use. Unplugging the Polar Care unit turns it off. Always unplug immediately after use. Never leave it plugged in while unattended. Remove pad.    FIRST ADD WATER TO FILL LINE, THEN ICE---Replace ice when existing ice  is almost melted  1 Discuss Treatment with your Licensed Health Care Practitioner and Use Only as Prescribed 2 Apply Insulation Barrier & Cold Therapy Pad 3 Check for Moisture 4 Inspect Skin Regularly  Tips and Trouble Shooting Usage Tips 1. Use cubed or chunked ice for optimal performance. 2. It is recommended to drain the Pad between uses. To drain the pad, hold the Pad upright with the hose pointed toward the ground. Depress the black plunger and allow water to drain out. 3. You may disconnect the Pad from the unit without removing the pad from the affected area by depressing the silver tabs on the hose coupling and gently pulling the hoses apart. The Pad and unit will seal itself and will not leak. Note: Some dripping during release is normal. 4. DO NOT RUN PUMP WITHOUT WATER! The pump in this unit is designed to run with water. Running the unit without water will cause permanent damage to the pump. 5. Unplug unit before removing lid.  TROUBLESHOOTING GUIDE Pump not running, Water not flowing to the pad, Pad is not getting cold 1. Make sure the transformer is plugged into the wall outlet. 2. Confirm that the ice and water are filled to the indicated levels. 3. Make sure there are no kinks in the pad. 4. Gently pull on the blue tube to make sure the tube/pad junction is straight. 5. Remove the pad from the treatment site and ll it while the pad is lying at; then reapply. 6. Confirm that the pad couplings are securely attached to the unit. Listen for the double clicks (  Figure 1) to confirm the pad couplings are securely attached.  Leaks    Note: Some condensation on the lines, controller, and pads is unavoidable, especially in warmer climates. 1. If using a Breg Polar Care Cold Therapy unit with a detachable Cold Therapy Pad, and a leak exists (other than condensation on the lines) disconnect the pad couplings. Make sure the silver tabs on the couplings are depressed before reconnecting the  pad to the pump hose; then confirm both sides of the coupling are properly clicked in. 2. If the coupling continues to leak or a leak is detected in the pad itself, stop using it and call [employer] Customer Care at [phone].  Cleaning After use, empty and dry the unit with a soft cloth. Warm water and mild detergent may be used occasionally to clean the pump and tubes.  WARNING: The Polar Care Cube can be cold enough to cause serious injury, including full skin necrosis. Follow these Operating Instructions, and carefully read the Product Insert (see pouch on side of unit) and the Cold Therapy Pad Fitting Instructions (provided with each Cold Therapy Pad) prior to use.       SHOULDER SLING IMMOBILIZER   VIDEO Slingshot 2 Shoulder Brace Application - YouTube ---https://www.porter.info/  INSTRUCTIONS While supporting the injured arm, slide the forearm into the sling. Wrap the adjustable shoulder strap around the neck and shoulders and attach the strap end to the sling using  the "alligator strap tab."  Adjust the shoulder strap to the required length. Position the shoulder pad behind the neck. To secure the shoulder pad location (optional), pull the shoulder strap away from the shoulder pad, unfold the hook material on the top of the pad, then press the shoulder strap back onto the hook material to secure the pad in place. Attach the closure strap across the open top of the sling. Position the strap so that it holds the arm securely in the sling. Next, attach the thumb strap to the open end of the sling between the thumb and fingers. After sling has been fit, it may be easily removed and reapplied using the quick release buckle on shoulder strap. If a neutral pillow or 15 abduction pillow is included, place the pillow at the waistline. Attach the sling to the pillow, lining up hook material on the pillow with the loop on sling. Adjust the waist strap to fit.  If waist strap  is too long, cut it to fit. Use the small piece of double sided hook material (located on top of the pillow) to secure the strap end. Place the double sided hook material on the inside of the cut strap end and secure it to the waist strap.     If no pillow is included, attach the waist strap to the sling and adjust to fit.    Washing Instructions: Straps and sling must be removed and cleaned regularly depending on your activity level and perspiration. Hand wash straps and sling in cold water with mild detergent, rinse, air dry

## 2023-04-24 NOTE — H&P (Signed)
History of Present Illness:  Norma Hickman is a 62 y.o. female who presents for follow-up now 18 months status post an extensive arthroscopic debridement with subacromial decompression, mini-open repair of a recurrent rotator cuff tear, and mini-open biceps tenodesis of the right shoulder. The patient notes that she was doing well following the procedure. However, about 6 months ago, she began to experience increased pain in the shoulder, both with activities at or above shoulder level, as well as 1 sleeping on her right side at night. She saw Horris Latino, PA-C, who ordered x-rays of her shoulder. These films demonstrated worsening degenerative changes of the glenohumeral joint. She was sent for an arthro-CT scan of the shoulder, then referred to me for further evaluation and treatment. The patient denies any pain at rest, but notes that she has more moderate to severe pain with activities at or above shoulder level or when trying to reach out away from her body, especially with any weight in her hand. She has been taking Tylenol and/or ibuprofen as necessary with limited benefit. In addition, she has been applying ice or ice alternating with heat to the shoulder, also with limited benefit. She is quite frustrated by her symptoms and function limitations, and is ready to consider more aggressive treatment options.  Current Outpatient Medications:  acetaminophen (TYLENOL) 500 MG tablet Take by mouth.  allopurinoL (ZYLOPRIM) 100 MG tablet Take 1 tablet (100 mg total) by mouth once daily 30 tablet 2  atorvastatin (LIPITOR) 20 MG tablet Take 1 tablet (20 mg total) by mouth once daily 90 tablet 1  betamethasone dipropionate, augmented, (DIPROLENE-AF) 0.05 % cream Apply topically Apply a thin film to affected hands two times daily until clear  blood glucose diagnostic test strip 1 each (1 strip total) 2 (two) times daily Use as instructed. 200 each 2  blood glucose meter kit as directed 1 each 0  clobetasol  (CORMAX) 0.05 % external solution APPLY TO AFFECTED AREA(S) TWICE A DAY 50 mL 1  escitalopram oxalate (LEXAPRO) 10 MG tablet Take 1 tablet (10 mg total) by mouth once daily 90 tablet 1  fluocinonide (LIDEX) 0.05 % external solution  folic acid (FOLVITE) 1 MG tablet  gabapentin (NEURONTIN) 300 MG capsule Take 1 capsule (300 mg total) by mouth at bedtime. 90 capsule 1  ibuprofen (MOTRIN) 200 MG tablet Take by mouth  ketoconazole (NIZORAL) 2 % shampoo once daily  lancets Use 1 each 2 (two) times daily Use as instructed. 200 each 1  lanolin alcohol-mo-w.pet-ceres (EUCERIN) cream Apply topically nightly as needed  lisinopriL-hydroCHLOROthiazide (ZESTORETIC) 20-12.5 mg tablet Take 2 tablets by mouth once daily 180 tablet 1  loratadine (CLARITIN) 10 mg tablet Take 1 tablet (10 mg total) by mouth once daily 30 tablet 5  secukinumab 300 mg/2 mL (150 mg/mL) PnIj Inject 300 mg subcutaneously every 28 (twenty-eight) days  traZODone (DESYREL) 100 MG tablet Take 1 tablet (100 mg total) by mouth at bedtime 90 tablet 1  triamcinolone 0.1 % cream once daily  albuterol 90 mcg/actuation inhaler Inhale 2 inhalations into the lungs every 6 (six) hours as needed for Wheezing 1 each 1   No current facility-administered medications for this visit.   Allergies:  Oxycodone Shortness Of Breath   Past Medical History:  Decreased hearing  History of cervical fracture  Hyperlipidemia  Hypertension   Past Surgical History:  Arthroscopic subacromial decompression, left shoulder 08/14/2001 (Dr. De Blanch)  Right shoulder rotator cuff repair. Right 2005 (Dr. De Blanch)  Repair of Lumbar incisional  hernia 08/24/2011 (Dr. Renda Rolls)  [employer] PS total knee replacement on the right. Right 11/26/2012 (Dr. De Blanch)  Extensive arthroscopic debridement, arthroscopic subacromial decompression, mini-open rotator cuff repair, and mini-open biceps tenodesis, right shoulder Right 10/11/2021 (Dr.Shelbia Scinto)   Foot surgery    Family History:  Osteoporosis (Thinning of bones) Mother  Rheum arthritis Mother  High blood pressure (Hypertension) Mother  Diabetes type II Father  Coronary Artery Disease (Blocked arteries around heart) Father  sp cabg  Alzheimer's disease Brother 3  Lung cancer Brother 3  Colon cancer Neg Hx  Breast cancer Neg Hx  Stroke Neg Hx   Social History:   Socioeconomic History:  Marital status: Single  Occupational History  Comment: Physical and Sports rehab tech  Tobacco Use  Smoking status: Never  Smokeless tobacco: Never  Vaping Use  Vaping status: Never Used  Substance and Sexual Activity  Alcohol use: No  Drug use: No  Sexual activity: Never  Social History Narrative  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 Single, no children. No tobacco or alcohol.   Review of Systems:  A comprehensive 14 point ROS was performed, reviewed, and the pertinent orthopaedic findings are documented in the HPI.  Physical Exam: Vitals:  04/13/23 1147  BP: 132/84  Weight: (!) 112.6 kg (248 lb 3.2 oz)  Height: 167.6 cm (5\' 6" )  PainSc: 0-No pain  PainLoc: Shoulder   General/Constitutional: Pleasant overweight middle-aged female in no acute distress. Neuro/Psych: Normal mood and affect, oriented to person, place and time. Eyes: Non-icteric. Pupils are equal, round, and reactive to light, and exhibit synchronous movement. ENT: Unremarkable. Lymphatic: No palpable adenopathy. Respiratory: Lungs clear to auscultation, Normal chest excursion, No wheezes, and Non-labored breathing Cardiovascular: Regular rate and rhythm. No murmurs. and No edema, swelling or tenderness, except as noted in detailed exam. Integumentary: No impressive skin lesions present, except as noted in detailed exam. Musculoskeletal: Unremarkable, except as noted in detailed exam.  Right shoulder exam: On examination, her surgical incision and arthroscopic portal sites remain well-healed and without evidence for  infection. No swelling, erythema, ecchymosis, abrasions, or other skin abnormalities are identified. She experiences mild as moderate tenderness to palpation over the antero-lateral aspect of the shoulder. Actively, she is able to forward flex to 90 degrees, abduct to 80 degrees, and internally rotate to L2. Passively, she can tolerate forward flexion to 150 degrees and abduction to 135 degrees. At 90 degrees of abduction, she is able to tolerate external rotation to 75 degrees and internal rotation to 40 degrees. She experiences moderate pain at the extremes of all motions. She demonstrates 4-4+/5 strength with resisted internal and external rotation, 4/5 strength with resisted abduction, and 3+/5 strength with resisted forward flexion. She has moderate pain with resisted forward flexion and abduction. She again is neurovascularly intact to the right upper extremity and hand.  Arthro-CT shoulder:  A recent [place] [facility] CT scan of the right shoulder is available for review and has been reviewed by myself. By report, the study demonstrates evidence of evidence of mild bursal surface partial-thickness tearing involving the anterior insertional fibers of the supraspinatus tendon with small full-thickness "fissures", permitting the dye to extrude into the subacromial space. At least moderate degenerative changes of the glenohumeral joint are noted with a large inferior humeral osteophyte. No lytic lesions or fractures are identified.  Assessment: 1. Primary osteoarthritis of right shoulder. 2. Status post right rotator cuff repair.  3. 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  4. Rotator cuff  tendinitis, right.    Plan: The treatment options were discussed with the patient. In addition, patient educational materials were provided regarding the diagnosis and treatment options. The patient is quite frustrated by her symptoms and function limitations, and is ready to consider more aggressive  treatment options. Therefore, I have recommended a surgical procedure, specifically a reverse left total shoulder arthroplasty. The procedure was discussed with the patient, as were the potential risks (including bleeding, infection, nerve and/or blood vessel injury, persistent or recurrent pain, loosening and/or failure of the components, dislocation, need for further surgery, blood clots, strokes, heart attacks and/or arhythmias, pneumonia, etc.) and benefits. The patient states his/her understanding and wishes to proceed. All of the patient's questions and concerns were answered. She can call any time with further concerns. She will follow up post-surgery, routine.    H&P reviewed and patient re-examined. No changes.

## 2023-04-24 NOTE — Evaluation (Signed)
Occupational Therapy Evaluation Patient Details Name: Norma Hickman MRN: [id number] DOB: 14-Apr-1961 Today's Date: 04/24/2023   History of Present Illness Norma Hickman is a 62 y.o. female who presents following recent extensive arthroscopic debridement with subacromial decompression, mini-open repair of a recurrent rotator cuff tear, and mini-open biceps tenodesis of the right shoulder. Now s/p Reverse right total shoulder arthroplasty on 04/24/23.   Clinical Impression   Norma Hickman was seen for OT evaluation this date. Prior to hospital admission, pt was IND including working full time. Pt currently requires MAX A don/doff sling, shirt, and polar care in sitting. SBA funcitonal mobility, minor balance deficits noted. Pt instructed in polar care mgt, compression stockings mgt, sling/immobilizer mgt, ROM exercises for RUE (with instructions for no shoulder exercises until full sensation has returned), RUE precautions, adaptive strategies for bathing/dressing/toileting/grooming, positioning and considerations for sleep, and home/routines modifications to maximize falls prevention, safety, and independence. Handout provided. All education complete, will sign off. Follow up per physician.     Recommendations for follow up therapy are one component of a multi-disciplinary discharge planning process, led by the attending physician.  Recommendations may be updated based on patient status, additional functional criteria and insurance authorization.   Assistance Recommended at Discharge Intermittent Supervision/Assistance  Patient can return home with the following A little help with walking and/or transfers;A little help with bathing/dressing/bathroom;Help with stairs or ramp for entrance    Functional Status Assessment  Patient has had a recent decline in their functional status and demonstrates the ability to make significant improvements in function in a reasonable and predictable amount of  time.  Equipment Recommendations  None recommended by OT    Recommendations for Other Services       Precautions / Restrictions Precautions Precautions: Fall;Shoulder Shoulder Interventions: Shoulder sling/immobilizer;Shoulder abduction pillow;Off for dressing/bathing/exercises Required Braces or Orthoses: Sling Restrictions Weight Bearing Restrictions: Yes RUE Weight Bearing: Non weight bearing      Mobility Bed Mobility               General bed mobility comments: found and left sitting    Transfers Overall transfer level: Needs assistance Equipment used: None Transfers: Sit to/from Stand Sit to Stand: Supervision                  Balance Overall balance assessment: Mild deficits observed, not formally tested                                         ADL either performed or assessed with clinical judgement   ADL Overall ADL's : Needs assistance/impaired                                       General ADL Comments: MAX A don/doff sling, shirt, and polar care in sitting. SBA funcitonal mobility, minor balance deficits noted      Pertinent Vitals/Pain Pain Assessment Pain Assessment: No/denies pain     Hand Dominance     Extremity/Trunk Assessment Upper Extremity Assessment Upper Extremity Assessment: RUE deficits/detail RUE: Unable to fully assess due to immobilization   Lower Extremity Assessment Lower Extremity Assessment: Overall WFL for tasks assessed       Communication Communication Communication: No difficulties   Cognition Arousal/Alertness: Awake/alert Behavior During Therapy: WFL for tasks assessed/performed Overall Cognitive Status: Within Functional  Limits for tasks assessed                                        Home Living Family/patient expects to be discharged to:: Private residence Living Arrangements: Alone Available Help at Discharge: Family;Available 24  hours/day Type of Home: House Home Access: Ramped entrance     Home Layout: One level                          Prior Functioning/Environment Prior Level of Function : Independent/Modified Independent;Working/employed             Mobility Comments: works as Tax inspector Problem List: Decreased strength;Decreased activity tolerance;Impaired balance (sitting and/or standing);Decreased safety awareness;Impaired UE functional use         OT Goals(Current goals can be found in the care plan section) Acute Rehab OT Goals Patient Stated Goal: to go home OT Goal Formulation: With patient/family Time For Goal Achievement: 05/08/23 Potential to Achieve Goals: Good   AM-PAC OT "6 Clicks" Daily Activity     Outcome Measure Help from another person eating meals?: None Help from another person taking care of personal grooming?: A Little Help from another person toileting, which includes using toliet, bedpan, or urinal?: A Little Help from another person bathing (including washing, rinsing, drying)?: A Lot Help from another person to put on and taking off regular upper body clothing?: A Lot Help from another person to put on and taking off regular lower body clothing?: A Little 6 Click Score: 17   End of Session    Activity Tolerance: Patient tolerated treatment well Patient left: in chair;with call bell/phone within reach;with nursing/sitter in room;with family/visitor present  OT Visit Diagnosis: Unsteadiness on feet (R26.81);Muscle weakness (generalized) (M62.81)                Time: 4540-9811 OT Time Calculation (min): 26 min Charges:  OT General Charges $OT Visit: 1 Visit OT Evaluation $OT Eval Low Complexity: 1 Low OT Treatments $Self Care/Home Management : 8-22 mins  Norma Hickman, M.S. OTR/L  04/24/23, 4:05 PM  ascom [phone]

## 2023-04-24 NOTE — Anesthesia Procedure Notes (Signed)
Anesthesia Regional Block: Interscalene brachial plexus block   Pre-Anesthetic Checklist: , timeout performed,  Correct Patient, Correct Site, Correct Laterality,  Correct Procedure, Correct Position, site marked,  Risks and benefits discussed,  Surgical consent,  Pre-op evaluation,  At surgeon's request and post-op pain management  Laterality: Right  Prep: chloraprep       Needles:  Injection technique: Single-shot  Needle Type: Echogenic Needle     Needle Length: 4cm  Needle Gauge: 25     Additional Needles:   Procedures:,,,, ultrasound used (permanent image in chart),,    Narrative:  Start time: 04/24/2023 10:08 AM End time: 04/24/2023 10:12 AM Injection made incrementally with aspirations every 5 mL.  Performed by: Personally  Anesthesiologist: Stephanie Coup, MD  Additional Notes: Patient's chart reviewed and they were deemed appropriate candidate for procedure, at surgeon's request. Patient educated about risks, benefits, and alternatives of the block including but not limited to: temporary or permanent nerve damage, bleeding, infection, damage to surround tissues, pneumothorax, hemidiaphragmatic paralysis, unilateral Horner's syndrome, block failure, local anesthetic toxicity. Patient expressed understanding. A formal time-out was conducted consistent with institution rules.  Monitors were applied, and minimal sedation used (see nursing record). The site was prepped with skin prep and allowed to dry, and sterile gloves were used. A high frequency linear ultrasound probe with probe cover was utilized throughout. C5-7 nerve roots located and appeared anatomically normal, local anesthetic injected around them, and echogenic block needle trajectory was monitored throughout. Aspiration performed every 5ml. Lung and blood vessels were avoided. All injections were performed without resistance and free of blood and paresthesias. The patient tolerated the procedure well.  Injectate:  20ml exparel + 10ml 0.5% bupivacaine

## 2023-04-24 NOTE — Op Note (Signed)
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  Post-Op Diagnosis:   Same  Procedure:   Reverse right total shoulder arthroplasty.  Surgeon:   Maryagnes Amos, MD  Assistant:   Horris Latino, PA-C  Anesthesia:   General endotracheal with an interscalene block using Exparel placed preoperatively by the anesthesiologist.  Findings:   As above.  Complications:   None  EBL:   150 cc  Fluids:   800 cc crystalloid  UOP:   None  TT:   None  Drains:   None  Closure:   Staples  Implants:   All press-fit Arthrex system with a #8 Apex humeral stem, a 33/36 mm SutureCup with a +3 insert, and a 24 mm +2 mm lateralized base plate with a 36 mm +4 mm lateralized glenosphere.  Brief Clinical Note:   The patient is a 62 year old female who is now status post 2 rotator cuff repairs, the most recent of which was done 1.5 years ago. The patient initially did well but has noticed progressively worsening pain over the past several months. The symptoms have persisted despite medications, activity modification, etc. Subsequent workup has confirmed the progression of significant glenohumeral degenerative joint disease with recurrent partial-thickness tearing of the rotator cuff. The patient presents at this time for a reverse right total shoulder arthroplasty.  Procedure:   The patient underwent placement of an interscalene block using Exparel by the anesthesiologist in the preoperative holding area before being brought into the operating room and lain in the supine position. The patient then underwent general endotracheal intubation and anesthesia before the patient was repositioned in the beach chair position using the beach chair positioner. The right shoulder and upper extremity were prepped with ChloraPrep solution before being draped sterilely.  Preoperative antibiotics were administered. A timeout was performed to verify the appropriate surgical site.    A standard anterior approach to the shoulder was made through an approximately 4-5 inch incision. The incision was carried down through the subcutaneous tissues to expose the deltopectoral fascia. The interval between the deltoid and pectoralis muscles was identified and this plane developed, retracting the cephalic vein laterally with the deltoid muscle. The conjoined tendon was identified. Its lateral margin was dissected and the Kolbel self-retraining retractor inserted. The "three sisters" were identified and cauterized. Bursal tissues were removed to improve visualization.   The biceps tendon was identified near the inferior aspect of the bicipital groove. A soft tissue tenodesis was performed by attaching the biceps tendon to the adjacent pectoralis major tendon using two #0 Ethibond interrupted sutures. The biceps tendon was then transected just proximal to the tenodesis site. The subscapularis tendon was released from its attachment to the lesser tuberosity 1 cm proximal to its insertion and several tagging sutures placed. The inferior capsule was released with care after identifying and protecting the axillary nerve. The proximal humeral cut was made at approximately 30 of retroversion using the extra-medullary guide.   Attention was redirected to the glenoid. The labrum was debrided circumferentially before the center of the glenoid was marked with electrocautery. The guidewire was drilled into the glenoid neck using the appropriate guide. After verifying its position, it was overreamed with the baseplate reamer to create a flat surface before the peripheral reamer was introduced to clean off any peripheral soft tissues. The central 10 mm coring reamer was then inserted  before the hole was tapped to complete the glenoid preparation. The permanent 24 mm with +2 mm of lateral offset and a 20  mm screw attachment mini-baseplate construct was screwed into place and tightened securely. The baseplate was then further secured using four peripheral locking screws. The permanent 36 mm +4 mm lateralized glenosphere was then impacted into place and its Morse taper locking mechanism verified using manual distraction. Finally, the glenosphere locking screw was inserted and tightened securely.  Attention was directed to the humeral side. The humeral canal was reamed with first the 5 mm and then the 6 mm reamer. The canal was broached beginning with a #5 broach and progressing to a #8 broach. This was left in place and the metaphyseal inset reamer was used to create the metaphyseal socket.  A trial reduction performed using the 36 mm trial humeral platform with the +3 mm insert. The arm demonstrated excellent range of motion as the hand could be brought across the chest to the opposite shoulder and brought to the top of the patient's head and to the patient's ear. The shoulder appeared stable throughout this range of motion. The joint was dislocated and the trial components removed. The permanent #8 Apex stem was impacted into place with care taken to maintain the appropriate version. The permanent 33/36 mm SutureCup humeral platform was then impacted into place before the +3 mm insert was snapped into place. The shoulder was relocated using two finger pressure and again placed through a range of motion with the findings as described above.  The wound was copiously irrigated with sterile saline solution using the jet lavage system before a total of 30 cc of 0.5% Sensorcaine with epinephrine was injected into the pericapsular and peri-incisional tissues to help with postoperative analgesia. The subscapularis tendon was reapproximated using #2 FiberWire interrupted sutures. The deltopectoral interval was closed using #0 Vicryl interrupted sutures before the subcutaneous tissues were closed using 2-0 Vicryl  interrupted sutures. The skin was closed using staples. Prior to closing the skin, 1 g of transexemic acid in 10 cc of normal saline was injected intra-articularly to help with postoperative bleeding. A sterile occlusive dressing was applied to the wound before the arm was placed into a shoulder immobilizer with an abduction pillow. A Polar Care system also was applied to the shoulder. The patient was then transferred back to a hospital bed before being awakened, extubated, and returned to the recovery room in satisfactory condition after tolerating the procedure well.

## 2023-04-24 NOTE — Transfer of Care (Signed)
Immediate Anesthesia Transfer of Care Note  Patient: Norma Hickman  Procedure(s) Performed: REVERSE SHOULDER ARTHROPLASTY (Right: Shoulder)  Patient Location: PACU  Anesthesia Type:General  Level of Consciousness: drowsy  Airway & Oxygen Therapy: Patient Spontanous Breathing and Patient connected to face mask oxygen  Post-op Assessment: Report given to RN and Post -op Vital signs reviewed and stable  Post vital signs: Reviewed and stable  Last Vitals:  Vitals Value Taken Time  BP 152/62 04/24/23 1327  Temp    Pulse 91 04/24/23 1330  Resp 17 04/24/23 1330  SpO2 95 % 04/24/23 1330  Vitals shown include unvalidated device data.  Last Pain:  Vitals:   04/24/23 0842  TempSrc: Oral  PainSc: 0-No pain         Complications: No notable events documented.

## 2023-04-24 NOTE — Anesthesia Procedure Notes (Signed)
Procedure Name: Intubation Date/Time: 04/24/2023 11:02 AM  Performed by: Monico Hoar, CRNAPre-anesthesia Checklist: Patient identified, Patient being monitored, Timeout performed, Emergency Drugs available and Suction available Patient Re-evaluated:Patient Re-evaluated prior to induction Oxygen Delivery Method: Circle system utilized Preoxygenation: Pre-oxygenation with 100% oxygen Induction Type: IV induction Ventilation: Mask ventilation without difficulty Laryngoscope Size: Mac and 4 Grade View: Grade I Tube type: Oral Tube size: 6.5 mm Number of attempts: 1 Airway Equipment and Method: Stylet Placement Confirmation: ETT inserted through vocal cords under direct vision, positive ETCO2 and breath sounds checked- equal and bilateral Secured at: 21 cm Tube secured with: Tape Dental Injury: Teeth and Oropharynx as per pre-operative assessment

## 2023-04-25 ENCOUNTER — Encounter: Payer: Self-pay | Admitting: Surgery

## 2023-04-25 DIAGNOSIS — Z471 Aftercare following joint replacement surgery: Secondary | ICD-10-CM | POA: Diagnosis not present

## 2023-04-25 DIAGNOSIS — Z96611 Presence of right artificial shoulder joint: Secondary | ICD-10-CM | POA: Diagnosis not present

## 2023-04-25 DIAGNOSIS — Z981 Arthrodesis status: Secondary | ICD-10-CM | POA: Diagnosis not present

## 2023-04-25 DIAGNOSIS — I10 Essential (primary) hypertension: Secondary | ICD-10-CM | POA: Diagnosis not present

## 2023-04-25 DIAGNOSIS — E119 Type 2 diabetes mellitus without complications: Secondary | ICD-10-CM | POA: Diagnosis not present

## 2023-04-25 DIAGNOSIS — Z96651 Presence of right artificial knee joint: Secondary | ICD-10-CM | POA: Diagnosis not present

## 2023-04-25 DIAGNOSIS — M4800 Spinal stenosis, site unspecified: Secondary | ICD-10-CM | POA: Diagnosis not present

## 2023-04-25 DIAGNOSIS — G540 Brachial plexus disorders: Secondary | ICD-10-CM | POA: Diagnosis not present

## 2023-04-25 DIAGNOSIS — E785 Hyperlipidemia, unspecified: Secondary | ICD-10-CM | POA: Diagnosis not present

## 2023-04-25 NOTE — Anesthesia Preprocedure Evaluation (Addendum)
Anesthesia Evaluation  Patient identified by MRN, date of birth, ID band Patient awake    Reviewed: Allergy & Precautions, NPO status , Patient's Chart, lab work & pertinent test results  History of Anesthesia Complications (+) PONV and history of anesthetic complications  Airway Mallampati: III  TM Distance: >3 FB Neck ROM: Limited    Dental no notable dental hx. (+) Dental Advidsory Given   Pulmonary neg pulmonary ROS, neg sleep apnea, neg COPD   breath sounds clear to auscultation- rhonchi (-) wheezing      Cardiovascular Exercise Tolerance: Good hypertension, Pt. on medications (-) CAD, (-) Past MI, (-) Cardiac Stents and (-) CABG  Rhythm:Regular Rate:Normal - Systolic murmurs and - Diastolic murmurs    Neuro/Psych neg Seizures PSYCHIATRIC DISORDERS Anxiety     negative neurological ROS     GI/Hepatic negative GI ROS, Neg liver ROS,,,  Endo/Other  negative endocrine ROSneg diabetes    Renal/GU negative Renal ROS     Musculoskeletal  (+) Arthritis ,    Abdominal  (+) + obese  Peds  Hematology negative hematology ROS (+)   Anesthesia Other Findings Past Medical History: No date: Arthritis No date: HLD (hyperlipidemia) No date: Hypertension No date: PONV (postoperative nausea and vomiting) No date: Psoriasis   Reproductive/Obstetrics                             Anesthesia Physical Anesthesia Plan  ASA: 2  Anesthesia Plan: General ETT   Post-op Pain Management:  Regional for Post-op pain and Regional block*   Induction: Intravenous  PONV Risk Score and Plan: 3 and Ondansetron, Aprepitant, Dexamethasone and Midazolam  Airway Management Planned: Oral ETT  Additional Equipment:   Intra-op Plan:   Post-operative Plan: Extubation in OR  Informed Consent: I have reviewed the patients History and Physical, chart, labs and discussed the procedure including the risks, benefits  and alternatives for the proposed anesthesia with the patient or authorized representative who has indicated his/her understanding and acceptance.     Dental Advisory Given  Plan Discussed with: Anesthesiologist, CRNA and Surgeon  Anesthesia Plan Comments: (Patient consented for risks of anesthesia including but not limited to:  - adverse reactions to medications - damage to eyes, teeth, lips or other oral mucosa - nerve damage due to positioning  - sore throat or hoarseness - Damage to heart, brain, nerves, lungs, other parts of body or loss of life  Patient voiced understanding.)        Anesthesia Quick Evaluation

## 2023-04-25 NOTE — Anesthesia Postprocedure Evaluation (Signed)
Anesthesia Post Note  Patient: Norma Hickman  Procedure(s) Performed: REVERSE SHOULDER ARTHROPLASTY (Right: Shoulder)  Patient location during evaluation: PACU Anesthesia Type: General Level of consciousness: awake and alert Pain management: pain level controlled Vital Signs Assessment: post-procedure vital signs reviewed and stable Respiratory status: spontaneous breathing, nonlabored ventilation, respiratory function stable and patient connected to nasal cannula oxygen Cardiovascular status: blood pressure returned to baseline and stable Postop Assessment: no apparent nausea or vomiting Anesthetic complications: no   No notable events documented.   Last Vitals:  Vitals:   04/24/23 1603 04/24/23 1640  BP: 123/72 (!) 135/59  Pulse: 89 75  Resp: 17 16  Temp: (!) 36.2 C (!) 36.1 C  SpO2: 94% 95%    Last Pain:  Vitals:   04/24/23 1640  TempSrc: Temporal  PainSc: 0-No pain                 Stephanie Coup

## 2023-04-27 DIAGNOSIS — Z471 Aftercare following joint replacement surgery: Secondary | ICD-10-CM | POA: Diagnosis not present

## 2023-04-27 DIAGNOSIS — Z96611 Presence of right artificial shoulder joint: Secondary | ICD-10-CM | POA: Diagnosis not present

## 2023-04-27 DIAGNOSIS — E119 Type 2 diabetes mellitus without complications: Secondary | ICD-10-CM | POA: Diagnosis not present

## 2023-04-27 DIAGNOSIS — G540 Brachial plexus disorders: Secondary | ICD-10-CM | POA: Diagnosis not present

## 2023-04-27 DIAGNOSIS — Z981 Arthrodesis status: Secondary | ICD-10-CM | POA: Diagnosis not present

## 2023-04-27 DIAGNOSIS — M4800 Spinal stenosis, site unspecified: Secondary | ICD-10-CM | POA: Diagnosis not present

## 2023-04-27 DIAGNOSIS — I10 Essential (primary) hypertension: Secondary | ICD-10-CM | POA: Diagnosis not present

## 2023-04-27 DIAGNOSIS — E785 Hyperlipidemia, unspecified: Secondary | ICD-10-CM | POA: Diagnosis not present

## 2023-04-27 DIAGNOSIS — Z96651 Presence of right artificial knee joint: Secondary | ICD-10-CM | POA: Diagnosis not present

## 2023-04-27 LAB — SURGICAL PATHOLOGY

## 2023-05-01 DIAGNOSIS — M4800 Spinal stenosis, site unspecified: Secondary | ICD-10-CM | POA: Diagnosis not present

## 2023-05-01 DIAGNOSIS — E119 Type 2 diabetes mellitus without complications: Secondary | ICD-10-CM | POA: Diagnosis not present

## 2023-05-01 DIAGNOSIS — Z471 Aftercare following joint replacement surgery: Secondary | ICD-10-CM | POA: Diagnosis not present

## 2023-05-01 DIAGNOSIS — Z96611 Presence of right artificial shoulder joint: Secondary | ICD-10-CM | POA: Diagnosis not present

## 2023-05-01 DIAGNOSIS — I10 Essential (primary) hypertension: Secondary | ICD-10-CM | POA: Diagnosis not present

## 2023-05-01 DIAGNOSIS — Z981 Arthrodesis status: Secondary | ICD-10-CM | POA: Diagnosis not present

## 2023-05-01 DIAGNOSIS — Z96651 Presence of right artificial knee joint: Secondary | ICD-10-CM | POA: Diagnosis not present

## 2023-05-01 DIAGNOSIS — G540 Brachial plexus disorders: Secondary | ICD-10-CM | POA: Diagnosis not present

## 2023-05-01 DIAGNOSIS — E785 Hyperlipidemia, unspecified: Secondary | ICD-10-CM | POA: Diagnosis not present

## 2023-05-03 ENCOUNTER — Other Ambulatory Visit: Payer: Self-pay

## 2023-05-03 DIAGNOSIS — G540 Brachial plexus disorders: Secondary | ICD-10-CM | POA: Diagnosis not present

## 2023-05-03 DIAGNOSIS — Z96651 Presence of right artificial knee joint: Secondary | ICD-10-CM | POA: Diagnosis not present

## 2023-05-03 DIAGNOSIS — E119 Type 2 diabetes mellitus without complications: Secondary | ICD-10-CM | POA: Diagnosis not present

## 2023-05-03 DIAGNOSIS — Z471 Aftercare following joint replacement surgery: Secondary | ICD-10-CM | POA: Diagnosis not present

## 2023-05-03 DIAGNOSIS — E785 Hyperlipidemia, unspecified: Secondary | ICD-10-CM | POA: Diagnosis not present

## 2023-05-03 DIAGNOSIS — I10 Essential (primary) hypertension: Secondary | ICD-10-CM | POA: Diagnosis not present

## 2023-05-03 DIAGNOSIS — Z96611 Presence of right artificial shoulder joint: Secondary | ICD-10-CM | POA: Diagnosis not present

## 2023-05-03 DIAGNOSIS — Z981 Arthrodesis status: Secondary | ICD-10-CM | POA: Diagnosis not present

## 2023-05-03 DIAGNOSIS — M4800 Spinal stenosis, site unspecified: Secondary | ICD-10-CM | POA: Diagnosis not present

## 2023-05-04 DIAGNOSIS — Z471 Aftercare following joint replacement surgery: Secondary | ICD-10-CM | POA: Diagnosis not present

## 2023-05-07 ENCOUNTER — Other Ambulatory Visit (HOSPITAL_COMMUNITY): Payer: Self-pay

## 2023-05-08 ENCOUNTER — Other Ambulatory Visit (HOSPITAL_COMMUNITY): Payer: Self-pay

## 2023-05-08 ENCOUNTER — Other Ambulatory Visit: Payer: Self-pay

## 2023-05-08 MED ORDER — BIMZELX 160 MG/ML ~~LOC~~ SOAJ: SUBCUTANEOUS | 4 refills | Status: DC

## 2023-05-10 DIAGNOSIS — E119 Type 2 diabetes mellitus without complications: Secondary | ICD-10-CM | POA: Diagnosis not present

## 2023-05-10 DIAGNOSIS — Z471 Aftercare following joint replacement surgery: Secondary | ICD-10-CM | POA: Diagnosis not present

## 2023-05-10 DIAGNOSIS — E785 Hyperlipidemia, unspecified: Secondary | ICD-10-CM | POA: Diagnosis not present

## 2023-05-10 DIAGNOSIS — Z981 Arthrodesis status: Secondary | ICD-10-CM | POA: Diagnosis not present

## 2023-05-10 DIAGNOSIS — M4800 Spinal stenosis, site unspecified: Secondary | ICD-10-CM | POA: Diagnosis not present

## 2023-05-10 DIAGNOSIS — Z96611 Presence of right artificial shoulder joint: Secondary | ICD-10-CM | POA: Diagnosis not present

## 2023-05-10 DIAGNOSIS — Z96651 Presence of right artificial knee joint: Secondary | ICD-10-CM | POA: Diagnosis not present

## 2023-05-10 DIAGNOSIS — G540 Brachial plexus disorders: Secondary | ICD-10-CM | POA: Diagnosis not present

## 2023-05-10 DIAGNOSIS — I10 Essential (primary) hypertension: Secondary | ICD-10-CM | POA: Diagnosis not present

## 2023-05-14 ENCOUNTER — Other Ambulatory Visit: Payer: Self-pay

## 2023-05-14 ENCOUNTER — Encounter: Payer: Self-pay | Admitting: Physical Therapy

## 2023-05-14 ENCOUNTER — Ambulatory Visit: Payer: 59 | Attending: Surgery | Admitting: Physical Therapy

## 2023-05-14 DIAGNOSIS — M79601 Pain in right arm: Secondary | ICD-10-CM | POA: Insufficient documentation

## 2023-05-14 DIAGNOSIS — M25511 Pain in right shoulder: Secondary | ICD-10-CM | POA: Insufficient documentation

## 2023-05-14 DIAGNOSIS — M25611 Stiffness of right shoulder, not elsewhere classified: Secondary | ICD-10-CM | POA: Diagnosis not present

## 2023-05-14 DIAGNOSIS — Z96611 Presence of right artificial shoulder joint: Secondary | ICD-10-CM | POA: Diagnosis not present
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
## 2023-05-15 DIAGNOSIS — G4733 Obstructive sleep apnea (adult) (pediatric): Secondary | ICD-10-CM | POA: Diagnosis not present

## 2023-05-16 ENCOUNTER — Other Ambulatory Visit (HOSPITAL_COMMUNITY): Payer: Self-pay

## 2023-05-16 ENCOUNTER — Other Ambulatory Visit: Payer: Self-pay

## 2023-05-16 ENCOUNTER — Ambulatory Visit: Payer: 59 | Admitting: Physical Therapy

## 2023-05-16 DIAGNOSIS — M25511 Pain in right shoulder: Secondary | ICD-10-CM | POA: Diagnosis not present

## 2023-05-16 DIAGNOSIS — Z96611 Presence of right artificial shoulder joint: Secondary | ICD-10-CM

## 2023-05-16 DIAGNOSIS — M25611 Stiffness of right shoulder, not elsewhere classified: Secondary | ICD-10-CM | POA: Diagnosis not present

## 2023-05-16 DIAGNOSIS — M79601 Pain in right arm: Secondary | ICD-10-CM | POA: Diagnosis not present
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
## 2023-05-18 ENCOUNTER — Other Ambulatory Visit: Payer: Self-pay

## 2023-05-21 ENCOUNTER — Other Ambulatory Visit: Payer: Self-pay

## 2023-05-22 ENCOUNTER — Other Ambulatory Visit (HOSPITAL_COMMUNITY): Payer: Self-pay

## 2023-05-22 ENCOUNTER — Ambulatory Visit: Payer: 59 | Admitting: Physical Therapy

## 2023-05-23 ENCOUNTER — Other Ambulatory Visit: Payer: Self-pay

## 2023-05-23 ENCOUNTER — Other Ambulatory Visit (HOSPITAL_COMMUNITY): Payer: Self-pay

## 2023-05-24 ENCOUNTER — Ambulatory Visit: Payer: 59 | Admitting: Physical Therapy

## 2023-05-24 DIAGNOSIS — Z96611 Presence of right artificial shoulder joint: Secondary | ICD-10-CM | POA: Diagnosis not present

## 2023-05-24 DIAGNOSIS — M25511 Pain in right shoulder: Secondary | ICD-10-CM | POA: Diagnosis not present

## 2023-05-24 DIAGNOSIS — M79601 Pain in right arm: Secondary | ICD-10-CM

## 2023-05-24 DIAGNOSIS — M25611 Stiffness of right shoulder, not elsewhere classified: Secondary | ICD-10-CM | POA: Diagnosis not present
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
## 2023-05-28 ENCOUNTER — Other Ambulatory Visit: Payer: Self-pay

## 2023-05-28 ENCOUNTER — Ambulatory Visit: Payer: 59 | Attending: Physician Assistant | Admitting: Pharmacist

## 2023-05-28 ENCOUNTER — Other Ambulatory Visit (HOSPITAL_COMMUNITY): Payer: Self-pay

## 2023-05-28 ENCOUNTER — Encounter (HOSPITAL_COMMUNITY): Payer: Self-pay

## 2023-05-28 DIAGNOSIS — Z7189 Other specified counseling: Secondary | ICD-10-CM

## 2023-05-28 MED ORDER — BIMZELX 160 MG/ML ~~LOC~~ SOAJ
SUBCUTANEOUS | 4 refills | Status: AC
  Filled 2023-05-28: qty 2, fill #0
  Filled 2023-05-30: qty 2, 28d supply, fill #0
  Filled 2023-06-28: qty 2, 28d supply, fill #1
  Filled 2023-07-26: qty 2, 28d supply, fill #2
  Filled 2023-08-21: qty 2, 28d supply, fill #3
  Filled 2023-09-26: qty 2, 28d supply, fill #4

## 2023-05-28 NOTE — Progress Notes (Signed)
   S: Patient presents today for review of their specialty medication.   Patient is currently taking Bimzelx for psoriasis. Patient is managed by Dr. Adolphus Birchwood for this.   Dosing: 320 mg subcutaneous given at weeks 0, 4, 8, 12, and 16 followed by 320 mg once every 4 weeks thereafter.   Adherence: confirmed   Efficacy: admits that she has not been on therapy long enough but thinks it's working okay.   Monitoring:  -TB testing: completed  -Liver enzymes: wnl (04/20/2023) -S/sx of injections: none  Current adverse effects: -Injection site reaction: none -GI side effects: none -HA: none -Oral candidiasis: none -Psychiatric effects: none  O: Lab Results  Component Value Date   WBC 11.1 (H) 11/13/2022   HGB 13.5 11/13/2022   HCT 42.4 11/13/2022   MCV 95.7 11/13/2022   PLT 241 11/13/2022      Chemistry      Component Value Date/Time   NA 141 04/20/2023 1117   NA 136 11/28/2012 0443   K 4.0 04/20/2023 1117   K 3.7 11/28/2012 0443   CL 106 04/20/2023 1117   CL 104 11/28/2012 0443   CO2 25 04/20/2023 1117   CO2 24 11/28/2012 0443   BUN 26 (H) 04/20/2023 1117   BUN 18 11/28/2012 0443   CREATININE 1.14 (H) 04/20/2023 1117   CREATININE 0.89 11/28/2012 0443      Component Value Date/Time   CALCIUM 9.4 04/20/2023 1117   CALCIUM 8.3 (L) 11/28/2012 0443   ALKPHOS 50 04/20/2023 1117   AST 39 04/20/2023 1117   ALT 36 04/20/2023 1117   BILITOT 0.7 04/20/2023 1117      A/P: 1. Medication review: patient currently on Bimzelx for psoriasis. She is managed by Dr. Adolphus Birchwood for this. She is tolerating it well with okay efficacy so far. Reviewed the medication with her including the following: bimekizumab is a human monoclonal Ab that selectively binds to IL-17A, IL-76F, and 17-AF cytokines. This decreases the release of proinflammatory cytokines and decreases inflammation involved in psoriasis. It is supplies as an autoinjector and PFS. It should be stored in the fridge but may be  removed before injection to allow to reach RT prior to administration. Injection site reaction, GI side effects, and headaches can occur but are not common. Rarely, Bimzelx can precipitate elevations in LFTs, psychiatric changes, or increase infection risks. Patient with no questions or concerns at this time. I have no recommendation for any changes.   Butch Penny, PharmD, P[place]y [place], CPP Clinical Pharmacist [facility] [phone]

## 2023-05-29 ENCOUNTER — Ambulatory Visit: Payer: 59 | Admitting: Physical Therapy

## 2023-05-30 ENCOUNTER — Other Ambulatory Visit: Payer: Self-pay

## 2023-05-30 ENCOUNTER — Other Ambulatory Visit (HOSPITAL_COMMUNITY): Payer: Self-pay

## 2023-05-31 ENCOUNTER — Ambulatory Visit: Payer: 59

## 2023-05-31 DIAGNOSIS — M79601 Pain in right arm: Secondary | ICD-10-CM | POA: Diagnosis not present

## 2023-05-31 DIAGNOSIS — Z96611 Presence of right artificial shoulder joint: Secondary | ICD-10-CM | POA: Diagnosis not present

## 2023-05-31 DIAGNOSIS — M25511 Pain in right shoulder: Secondary | ICD-10-CM | POA: Diagnosis not present

## 2023-05-31 DIAGNOSIS — M25611 Stiffness of right shoulder, not elsewhere classified: Secondary | ICD-10-CM | POA: Diagnosis not present
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
## 2023-06-04 ENCOUNTER — Ambulatory Visit: Payer: 59 | Attending: Surgery | Admitting: Physical Therapy

## 2023-06-04 ENCOUNTER — Other Ambulatory Visit (HOSPITAL_COMMUNITY): Payer: Self-pay

## 2023-06-04 DIAGNOSIS — M79601 Pain in right arm: Secondary | ICD-10-CM | POA: Insufficient documentation

## 2023-06-04 DIAGNOSIS — Z96611 Presence of right artificial shoulder joint: Secondary | ICD-10-CM | POA: Insufficient documentation
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
## 2023-06-05 ENCOUNTER — Ambulatory Visit: Payer: 59 | Admitting: Physical Therapy

## 2023-06-06 ENCOUNTER — Other Ambulatory Visit: Payer: Self-pay

## 2023-06-06 MED ORDER — ALLOPURINOL 100 MG PO TABS
100.0000 mg | ORAL_TABLET | Freq: Every day | ORAL | 2 refills | Status: DC
  Filled 2023-06-06: qty 30, 30d supply, fill #0
  Filled 2023-07-05: qty 30, 30d supply, fill #1
  Filled 2023-08-03: qty 30, 30d supply, fill #2

## 2023-06-06 MED ORDER — TRAZODONE HCL 100 MG PO TABS
100.0000 mg | ORAL_TABLET | Freq: Every day | ORAL | 1 refills | Status: DC
  Filled 2023-06-06: qty 90, 90d supply, fill #0
  Filled 2023-08-19: qty 90, 90d supply, fill #1

## 2023-06-12 ENCOUNTER — Ambulatory Visit: Payer: 59 | Admitting: Physical Therapy

## 2023-06-18 ENCOUNTER — Ambulatory Visit: Payer: 59 | Admitting: Physical Therapy

## 2023-06-18 ENCOUNTER — Other Ambulatory Visit: Payer: Self-pay

## 2023-06-18 DIAGNOSIS — M79601 Pain in right arm: Secondary | ICD-10-CM

## 2023-06-18 DIAGNOSIS — Z96611 Presence of right artificial shoulder joint: Secondary | ICD-10-CM | POA: Diagnosis not present
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
## 2023-06-19 ENCOUNTER — Encounter: Payer: 59 | Admitting: Physical Therapy

## 2023-06-21 ENCOUNTER — Encounter: Payer: 59 | Admitting: Physical Therapy

## 2023-06-26 ENCOUNTER — Ambulatory Visit: Payer: 59 | Admitting: Physical Therapy

## 2023-06-26 DIAGNOSIS — M79601 Pain in right arm: Secondary | ICD-10-CM | POA: Diagnosis not present

## 2023-06-26 DIAGNOSIS — Z96611 Presence of right artificial shoulder joint: Secondary | ICD-10-CM
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
## 2023-06-28 ENCOUNTER — Ambulatory Visit: Payer: 59 | Admitting: Physical Therapy

## 2023-06-28 ENCOUNTER — Other Ambulatory Visit (HOSPITAL_COMMUNITY): Payer: Self-pay

## 2023-06-28 ENCOUNTER — Other Ambulatory Visit: Payer: Self-pay

## 2023-06-29 ENCOUNTER — Other Ambulatory Visit: Payer: Self-pay

## 2023-07-02 ENCOUNTER — Ambulatory Visit: Payer: 59 | Admitting: Physical Therapy

## 2023-07-02 ENCOUNTER — Telehealth: Payer: Self-pay | Admitting: Physical Therapy

## 2023-07-02 NOTE — Telephone Encounter (Signed)
Pt called to let office know that she was not feeling well enough to attend today's apt and that she would need to cancel.

## 2023-07-04 ENCOUNTER — Ambulatory Visit: Payer: 59 | Admitting: Physical Therapy

## 2023-07-05 ENCOUNTER — Ambulatory Visit: Payer: 59 | Admitting: Physical Therapy

## 2023-07-05 ENCOUNTER — Other Ambulatory Visit: Payer: Self-pay

## 2023-07-06 ENCOUNTER — Other Ambulatory Visit: Payer: Self-pay

## 2023-07-10 ENCOUNTER — Ambulatory Visit: Payer: 59 | Admitting: Physical Therapy

## 2023-07-10 ENCOUNTER — Other Ambulatory Visit: Payer: Self-pay

## 2023-07-11 ENCOUNTER — Ambulatory Visit: Payer: 59 | Admitting: Physical Therapy

## 2023-07-12 ENCOUNTER — Encounter: Payer: 59 | Admitting: Physical Therapy

## 2023-07-18 ENCOUNTER — Ambulatory Visit: Payer: 59 | Attending: Surgery | Admitting: Physical Therapy

## 2023-07-18 ENCOUNTER — Encounter: Payer: Self-pay | Admitting: Physical Therapy

## 2023-07-18 DIAGNOSIS — M79601 Pain in right arm: Secondary | ICD-10-CM | POA: Diagnosis not present

## 2023-07-18 DIAGNOSIS — Z96611 Presence of right artificial shoulder joint: Secondary | ICD-10-CM | POA: Diagnosis not present
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
## 2023-07-19 ENCOUNTER — Encounter: Payer: 59 | Admitting: Physical Therapy

## 2023-07-20 IMAGING — XA IR FLUORO GUIDE NDL PLMT / BX
1 series · 2 of 2 positions shown · non-contrast
Comparison: none

CLINICAL DATA: Patient complains of right shoulder pain and limited
range of motion, no known specific injury. Patient does have a prior
history of rotator cuff repair several years ago.

EXAM:
EXAM
RIGHT SHOULDER INJECTION UNDER FLUOROSCOPY FOR CT
FLUOROSCOPY TIME:  6 SECONDS, 0.70 mGy
TECHNIQUE: Risks, benefits and alternatives to the procedure were discussed
with the patient, all questions were answered and informed consent
was obtained.

[Series 1: interv standard · 2 of 2 slices shown]
[im 1/2]
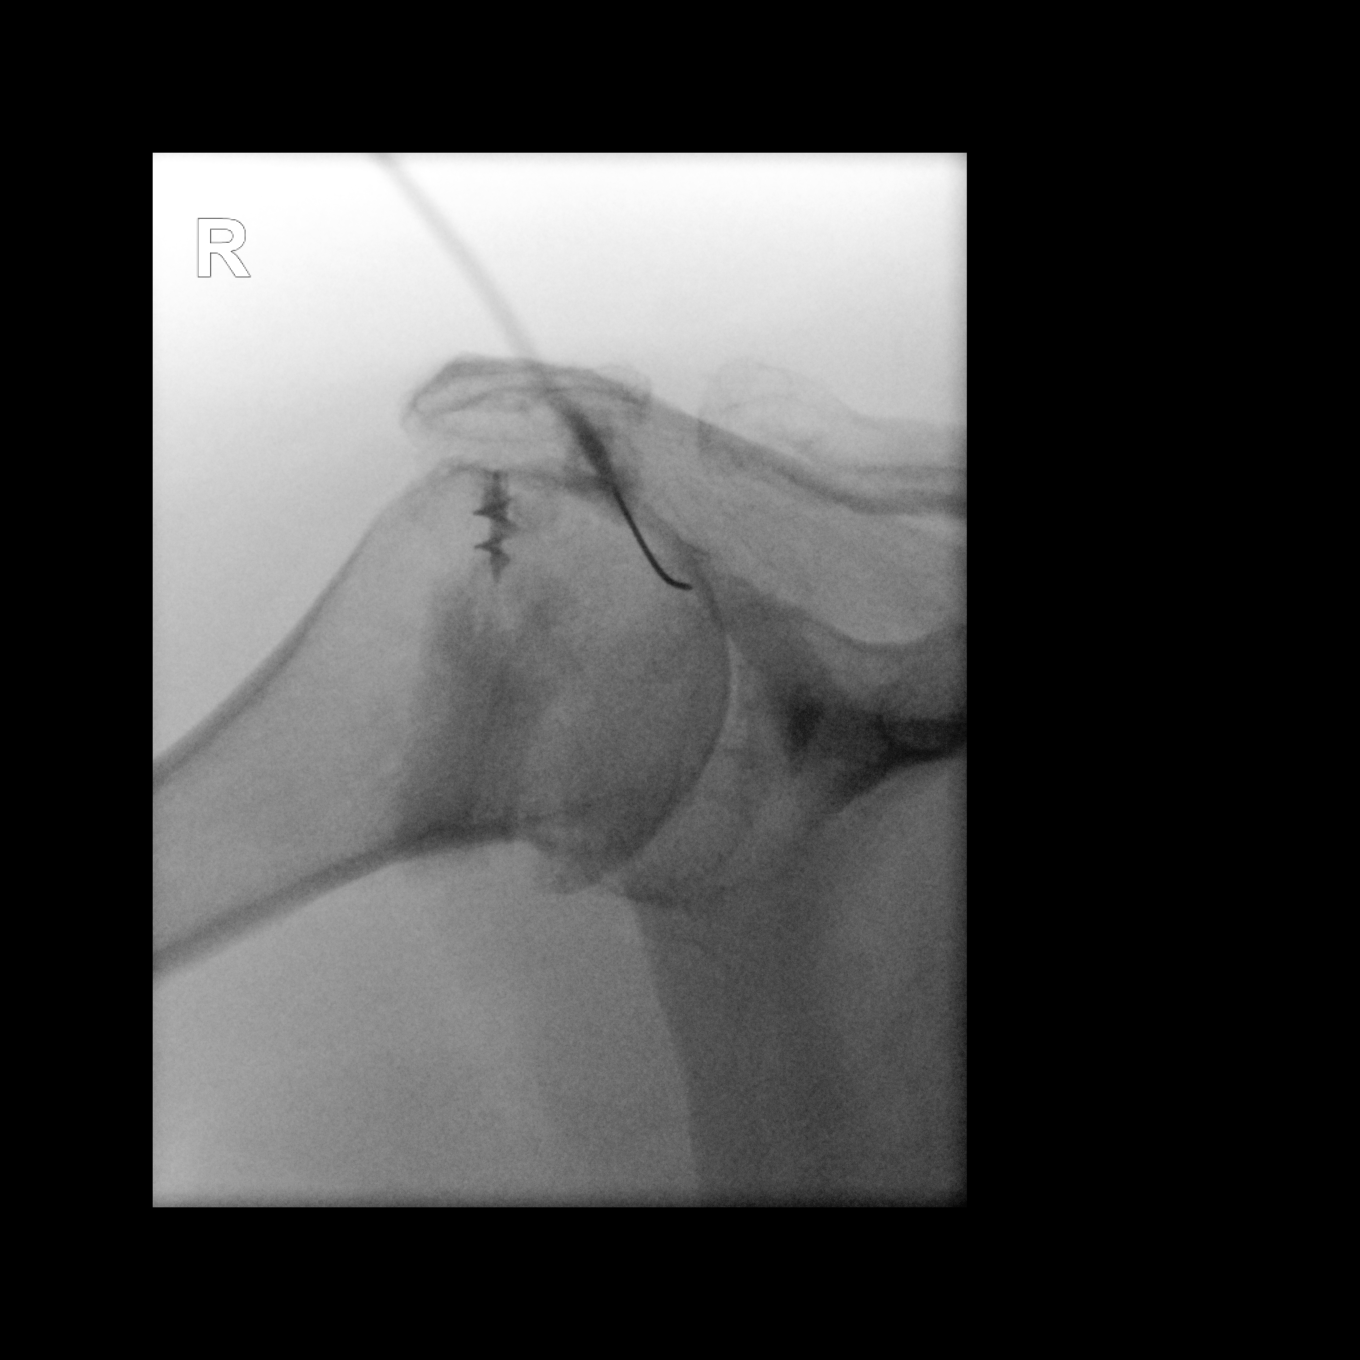
[im 2/2]
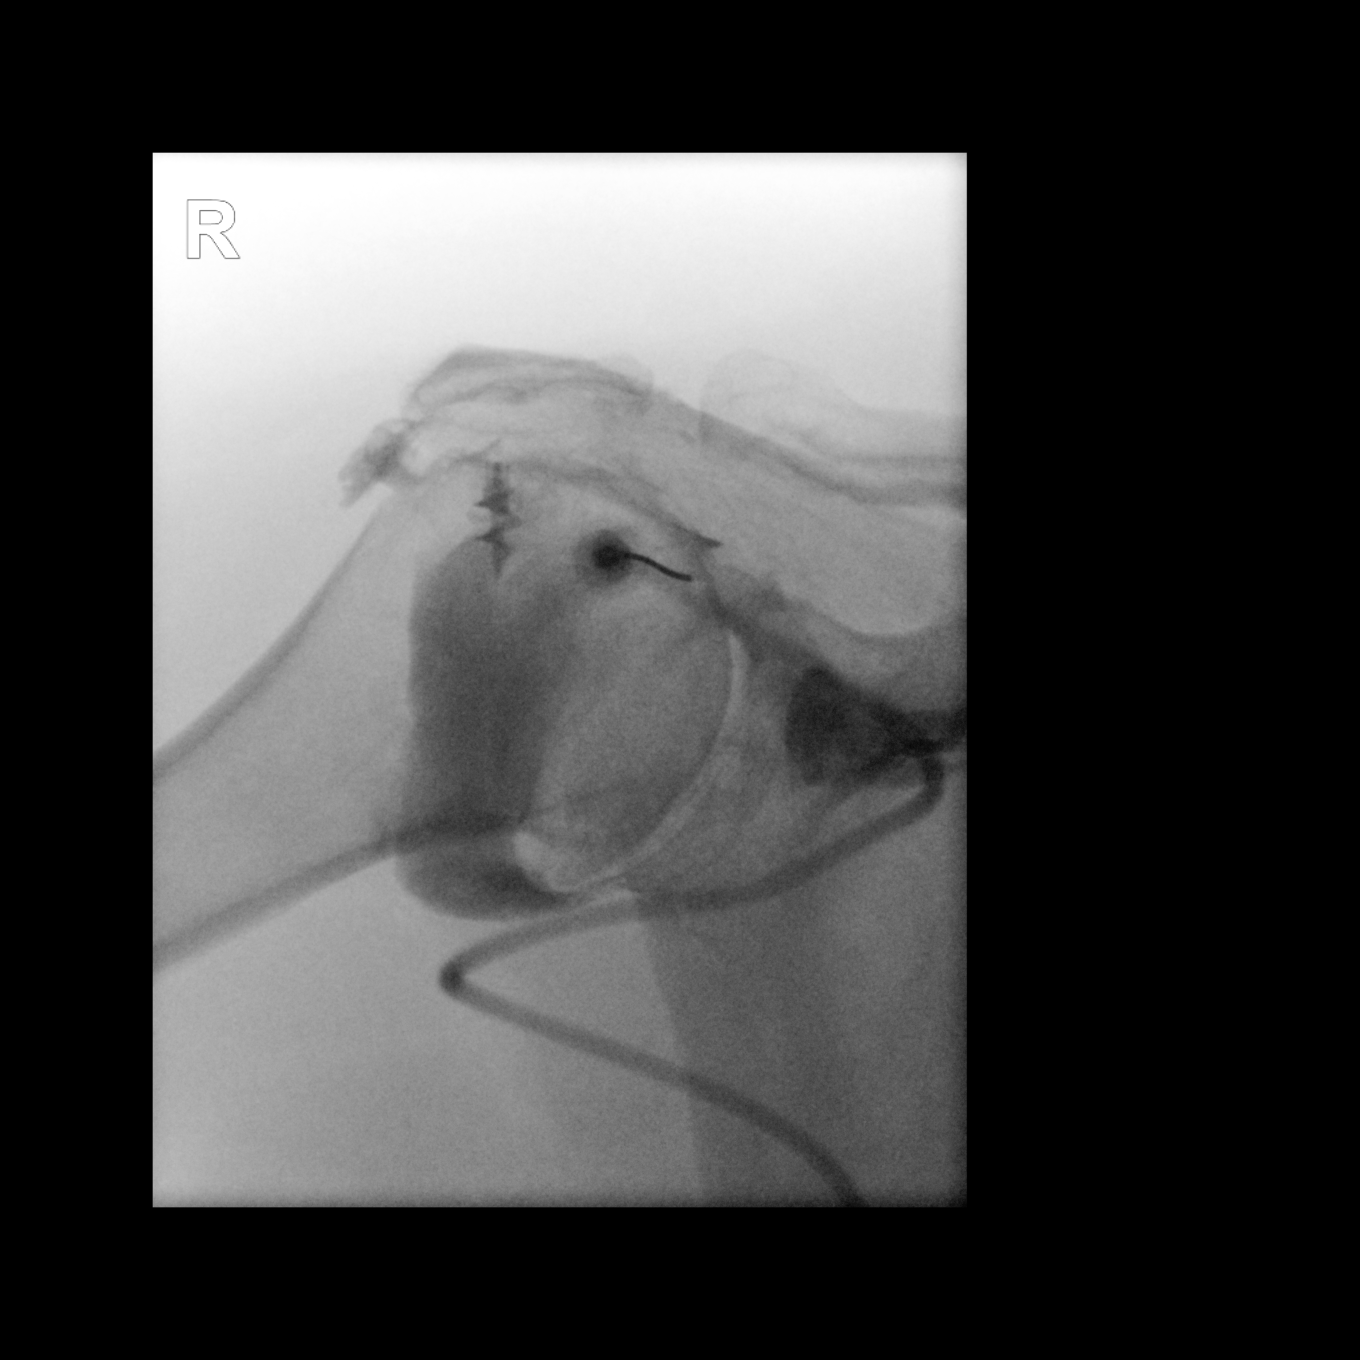

[2 of 2 positions shown; findings below may reference images not displayed]

An appropriate skin entry site was determined under fluoroscopy.
Skin site was marked, prepped with chlorhexidine, and draped in
usual sterile fashion, and infiltrated locally with 1% lidocaine.

22-gauge spinal needle advanced to the superior medial margin of the
humeral head. 1 mL of lidocaine 1% injected easily confirming loss
of resistance and intra-articular location. 15ml of a mixture of
saline, and Omnipaque 180 was injected into the shoulder joint.
Intraarticular flow was confirmed on fluoroscopy. Patient
transferred to CT.

COMPLICATIONS:
COMPLICATIONS
none
IMPRESSION: 1. Technically successful right shoulder joint injection prior to
CT.

## 2023-07-20 IMAGING — CT CT SHOULDER*R* W/CM
1 series · 11 of 14 positions shown, 14 images · non-contrast
Comparison: Injection images same date.

CLINICAL DATA: Right shoulder pain with limited mobility for 2
years. No known injury. Previous rotator cuff repair in 1001.

EXAM:
CT ARTHROGRAPHY OF THE right shoulder
TECHNIQUE: Multidetector CT imaging was performed following the standard
protocol after injection of dilute contrast into the joint.
Multidetector CT imaging of the right shoulder was performed
according to the standard protocol following intravenous contrast
administration.

[Series 8: ax st · axial · 0.49mm/px · z∈[-284,-151]mm · 11 of 98 slices shown, 14 images]
[im 8/98  soft-tissue]
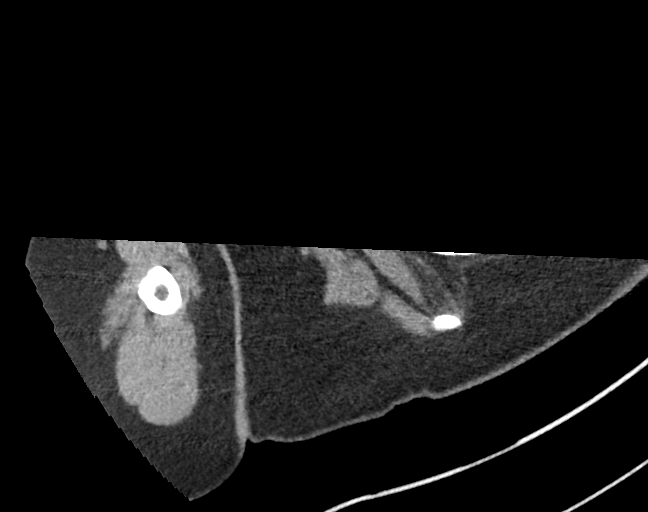
[im 8/98  bone]
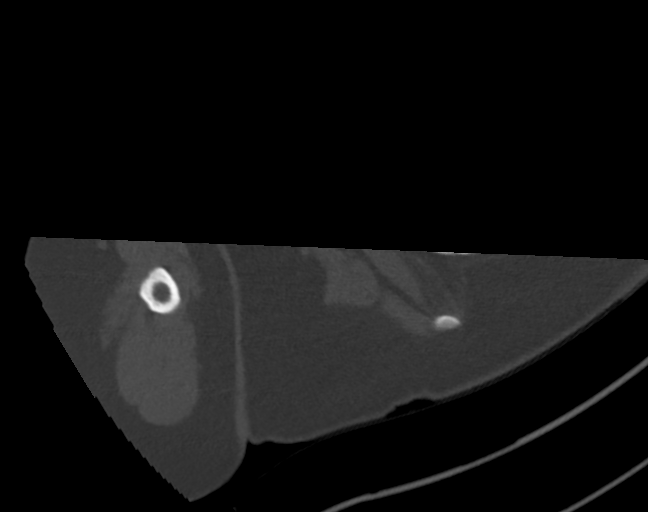
[im 15/98  bone]
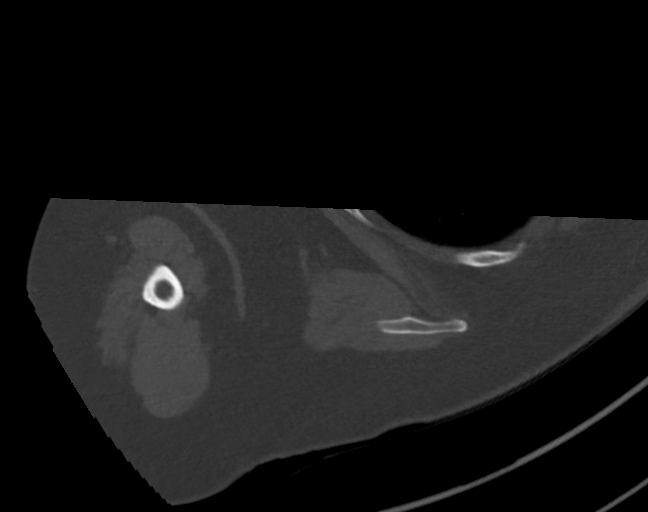
[im 23/98  bone]
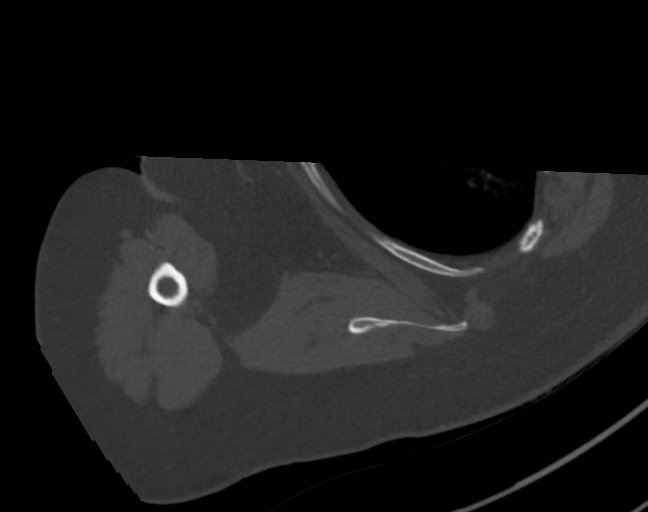
[im 30/98  bone]
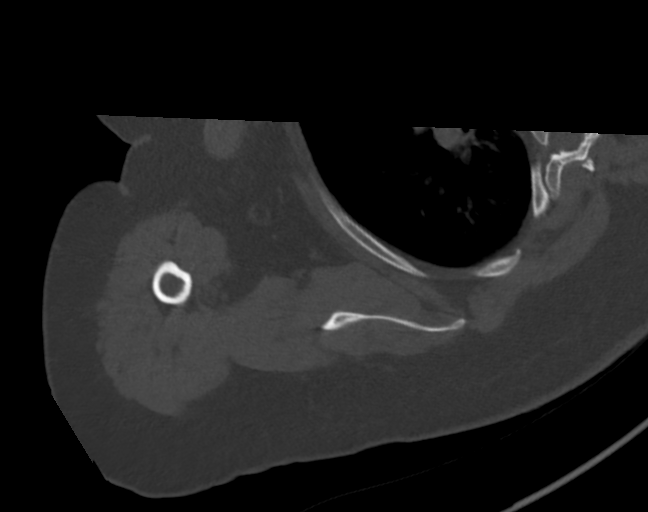
[im 38/98  soft-tissue]
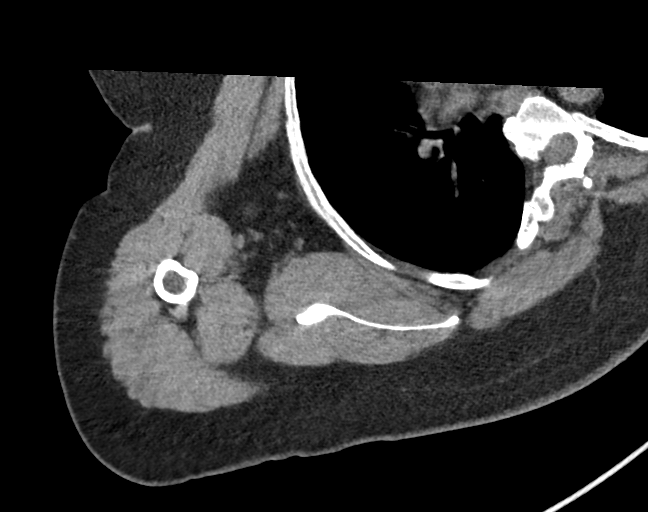
[im 38/98  bone]
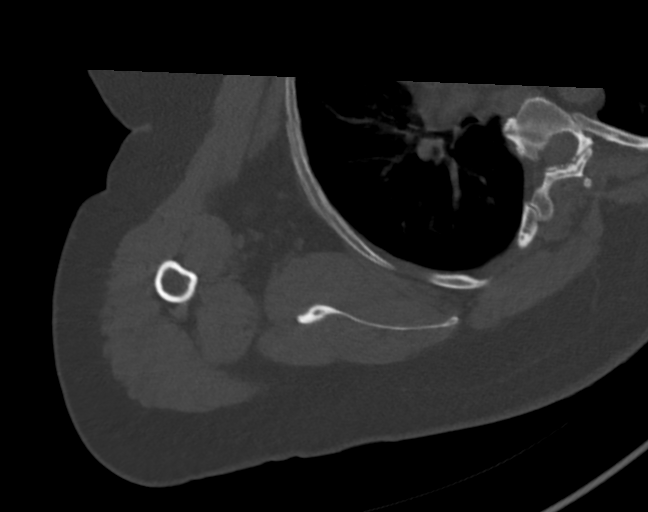
[im 53/98  bone]
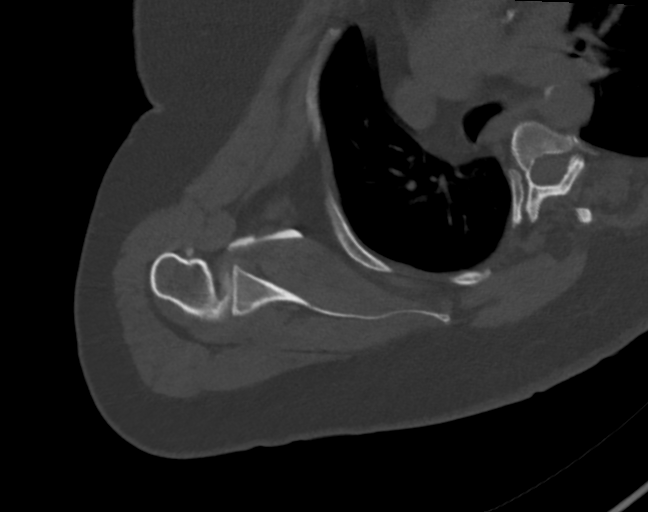
[im 60/98  bone]
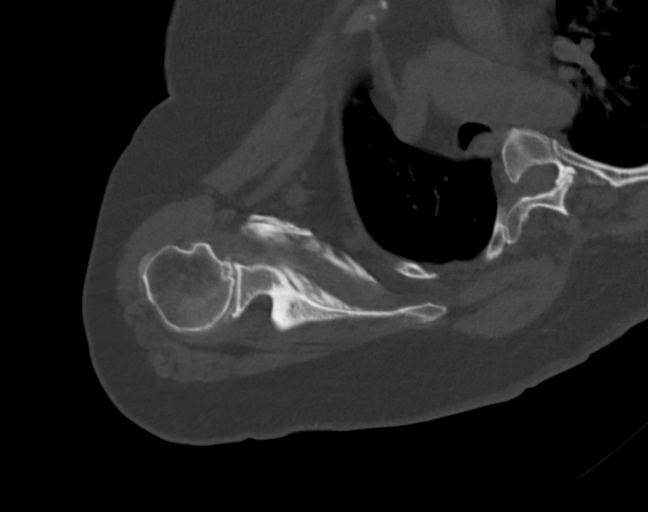
[im 68/98  bone]
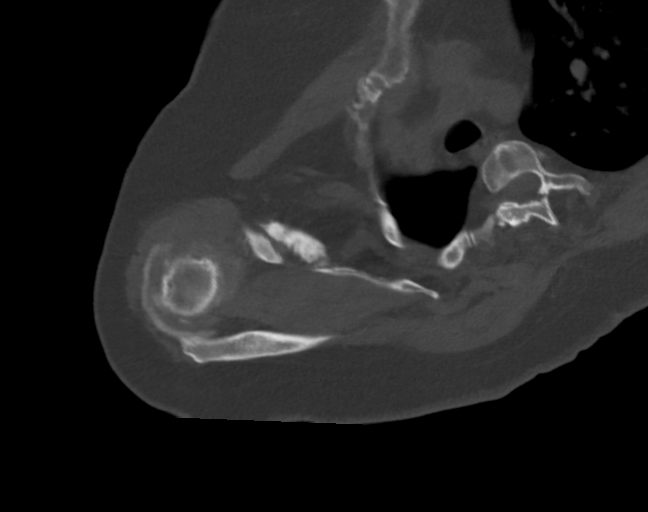
[im 75/98  soft-tissue]
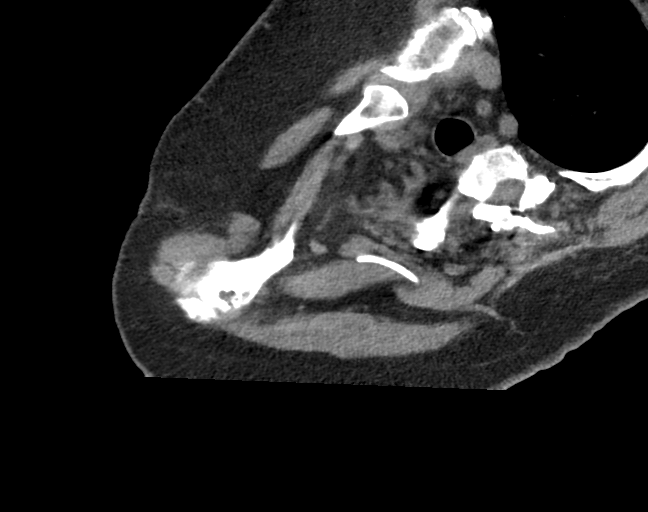
[im 75/98  bone]
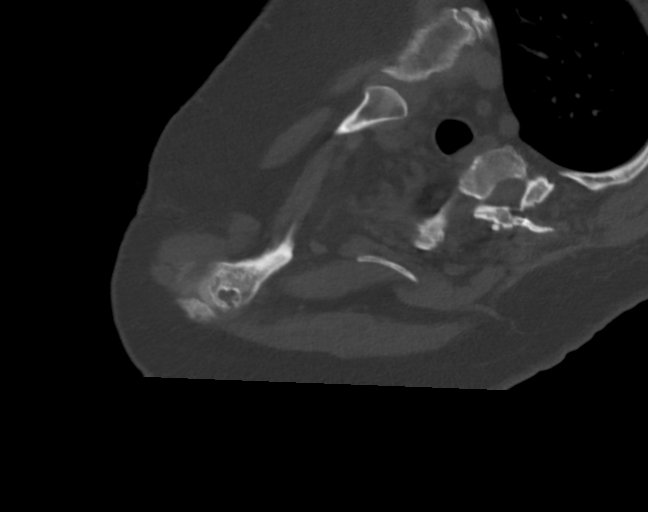
[im 83/98  bone]
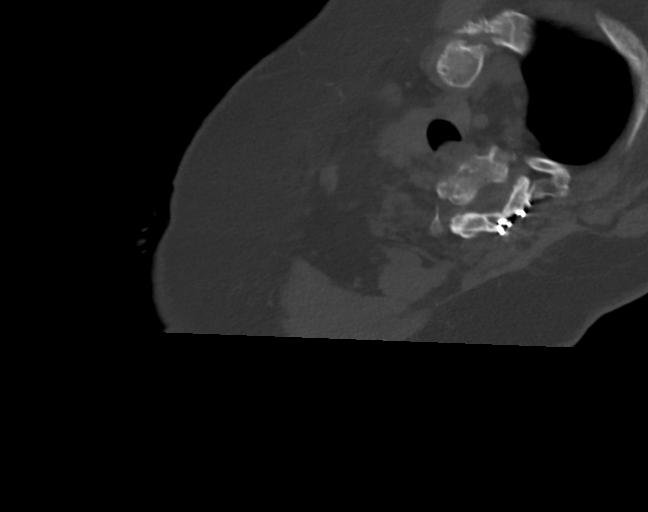
[im 90/98  bone]
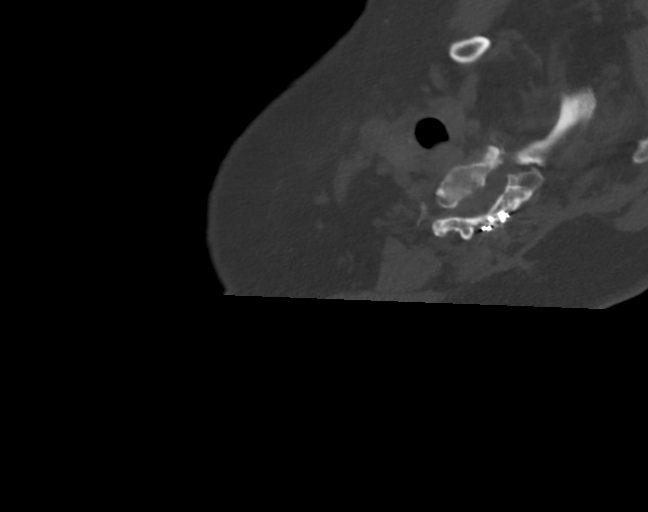

[11 of 14 positions shown; findings below may reference images not displayed]

Report only from right
shoulder arthrogram 07/12/2004 and right shoulder MRI 04/18/2004
(images unavailable).
FINDINGS: Bones/Joint/Cartilage

There is a metallic anchor screw within the greater tuberosity
consistent with previous rotator cuff repair. Much of the contrast
placed into the shoulder joint has leaked into the
subacromial-subdeltoid space. There are mild glenohumeral
degenerative changes. There are postsurgical changes in the acromion
consistent with previous subacromial decompression. There is
fragmented spurring of the posterosuperior glenoid.

Ligaments

Ligaments are suboptimally evaluated by CT.

Muscles and Tendons
As above, much of the contrast placed into the shoulder joint has
leaked into the subacromial-subdeltoid space. Although no
well-defined retracted rotator cuff tear is identified, there is a
possible small full-thickness defect in the supraspinatus tendon
near the critical zone, best seen on coronal image 69/5. The distal
infraspinatus tendon is also diffusely attenuated with a possible
small full-thickness tear, best seen on both the coronal and
sagittal images. The subscapularis and teres minor tendons are
intact. No focal rotator cuff muscular atrophy. Mild lateral deltoid
muscular atrophy may be postsurgical. The biceps tendon is not well
visualized.

Soft Tissues
Mild soft tissue stranding in the anterior subcutaneous fat
attributed to the injection. No evidence of periarticular fluid
collection or inflammation. Incompletely postsurgical changes at the
cervicothoracic junction.
IMPRESSION: 1. Leakage of contrast from the glenohumeral joint into the
subacromial-subdeltoid space with probable small recurrent
full-thickness tears involving the critical zone of the
supraspinatus tendon and the insertion of the infraspinatus tendon.
No significant tendon retraction or focal rotator cuff muscular
atrophy.
2. The long head of the biceps tendon is not well visualized and may
be torn or previously released.
3. Postsurgical changes consistent with previous subacromial
decompression.

## 2023-07-25 ENCOUNTER — Ambulatory Visit: Payer: 59 | Admitting: Physical Therapy

## 2023-07-25 ENCOUNTER — Encounter: Payer: Self-pay | Admitting: Physical Therapy

## 2023-07-25 DIAGNOSIS — Z96611 Presence of right artificial shoulder joint: Secondary | ICD-10-CM | POA: Diagnosis not present

## 2023-07-25 DIAGNOSIS — M79601 Pain in right arm: Secondary | ICD-10-CM | POA: Diagnosis not present
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
## 2023-07-25 NOTE — Addendum Note (Signed)
Addended by: Johnn Hai on: 07/25/2023 04:29 PM   Modules accepted: Orders

## 2023-07-26 ENCOUNTER — Other Ambulatory Visit (HOSPITAL_COMMUNITY): Payer: Self-pay

## 2023-07-30 ENCOUNTER — Other Ambulatory Visit (HOSPITAL_COMMUNITY): Payer: Self-pay

## 2023-08-03 ENCOUNTER — Other Ambulatory Visit: Payer: Self-pay

## 2023-08-07 ENCOUNTER — Other Ambulatory Visit: Payer: Self-pay

## 2023-08-09 ENCOUNTER — Other Ambulatory Visit: Payer: Self-pay

## 2023-08-09 ENCOUNTER — Encounter: Payer: Self-pay | Admitting: Physical Therapy

## 2023-08-09 ENCOUNTER — Ambulatory Visit: Payer: 59 | Attending: Surgery | Admitting: Physical Therapy

## 2023-08-09 DIAGNOSIS — M79601 Pain in right arm: Secondary | ICD-10-CM | POA: Diagnosis not present

## 2023-08-09 DIAGNOSIS — Z96611 Presence of right artificial shoulder joint: Secondary | ICD-10-CM | POA: Diagnosis not present

## 2023-08-09 MED ORDER — ESCITALOPRAM OXALATE 10 MG PO TABS
10.0000 mg | ORAL_TABLET | Freq: Every day | ORAL | 0 refills | Status: DC
  Filled 2023-08-09: qty 90, 90d supply, fill #0
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
## 2023-08-10 ENCOUNTER — Other Ambulatory Visit: Payer: Self-pay

## 2023-08-16 ENCOUNTER — Encounter: Payer: 59 | Admitting: Physical Therapy

## 2023-08-16 DIAGNOSIS — G4733 Obstructive sleep apnea (adult) (pediatric): Secondary | ICD-10-CM | POA: Diagnosis not present

## 2023-08-19 ENCOUNTER — Other Ambulatory Visit: Payer: Self-pay

## 2023-08-20 ENCOUNTER — Other Ambulatory Visit: Payer: Self-pay

## 2023-08-21 ENCOUNTER — Other Ambulatory Visit: Payer: Self-pay

## 2023-08-21 ENCOUNTER — Other Ambulatory Visit (HOSPITAL_COMMUNITY): Payer: Self-pay

## 2023-08-23 ENCOUNTER — Other Ambulatory Visit: Payer: Self-pay

## 2023-08-23 MED ORDER — GABAPENTIN 300 MG PO CAPS
300.0000 mg | ORAL_CAPSULE | Freq: Every day | ORAL | 1 refills | Status: DC
  Filled 2023-08-23: qty 90, 90d supply, fill #0
  Filled 2023-11-19: qty 90, 90d supply, fill #1

## 2023-08-25 ENCOUNTER — Encounter (HOSPITAL_COMMUNITY): Payer: Self-pay

## 2023-09-11 ENCOUNTER — Other Ambulatory Visit: Payer: Self-pay

## 2023-09-11 DIAGNOSIS — E785 Hyperlipidemia, unspecified: Secondary | ICD-10-CM | POA: Diagnosis not present

## 2023-09-11 DIAGNOSIS — R7303 Prediabetes: Secondary | ICD-10-CM | POA: Diagnosis not present

## 2023-09-11 DIAGNOSIS — Z0000 Encounter for general adult medical examination without abnormal findings: Secondary | ICD-10-CM | POA: Diagnosis not present

## 2023-09-11 DIAGNOSIS — I10 Essential (primary) hypertension: Secondary | ICD-10-CM | POA: Diagnosis not present

## 2023-09-11 DIAGNOSIS — Z23 Encounter for immunization: Secondary | ICD-10-CM | POA: Diagnosis not present

## 2023-09-11 MED ORDER — ESCITALOPRAM OXALATE 10 MG PO TABS
10.0000 mg | ORAL_TABLET | Freq: Every day | ORAL | 2 refills | Status: DC
  Filled 2023-09-11 – 2023-11-19 (×2): qty 90, 90d supply, fill #0
  Filled 2024-03-29: qty 90, 90d supply, fill #1
  Filled 2024-08-24: qty 90, 90d supply, fill #2

## 2023-09-11 MED ORDER — LISINOPRIL-HYDROCHLOROTHIAZIDE 20-12.5 MG PO TABS
2.0000 | ORAL_TABLET | Freq: Every day | ORAL | 2 refills | Status: AC
  Filled 2023-09-11 – 2023-10-25 (×2): qty 180, 90d supply, fill #0

## 2023-09-11 MED ORDER — ALLOPURINOL 100 MG PO TABS
100.0000 mg | ORAL_TABLET | Freq: Every day | ORAL | 2 refills | Status: DC
  Filled 2023-09-11: qty 30, 30d supply, fill #0
  Filled 2023-10-16: qty 30, 30d supply, fill #1
  Filled 2023-11-19: qty 30, 30d supply, fill #2

## 2023-09-11 MED ORDER — TRIAMCINOLONE ACETONIDE 0.1 % EX CREA
TOPICAL_CREAM | CUTANEOUS | 2 refills | Status: AC
  Filled 2023-09-11: qty 30, 30d supply, fill #0
  Filled 2023-10-16: qty 30, 30d supply, fill #1

## 2023-09-20 ENCOUNTER — Other Ambulatory Visit: Payer: Self-pay

## 2023-09-26 ENCOUNTER — Other Ambulatory Visit: Payer: Self-pay

## 2023-09-26 NOTE — Progress Notes (Signed)
Specialty Pharmacy Refill Coordination Note  Norma Hickman is a 62 y.o. female contacted today regarding refills of specialty medication(s) Bimekizumab-Bkzx   Patient requested Delivery   Delivery date: 10/05/23   Verified address: [address] [place] [address]   Medication will be filled on 10/04/23.

## 2023-10-16 ENCOUNTER — Other Ambulatory Visit: Payer: Self-pay

## 2023-10-17 DIAGNOSIS — L400 Psoriasis vulgaris: Secondary | ICD-10-CM | POA: Diagnosis not present

## 2023-10-25 ENCOUNTER — Other Ambulatory Visit: Payer: Self-pay

## 2023-10-25 ENCOUNTER — Encounter (HOSPITAL_COMMUNITY): Payer: Self-pay

## 2023-10-25 NOTE — Progress Notes (Signed)
Specialty Pharmacy Ongoing Clinical Assessment Note  Norma Hickman is a 62 y.o. female who is being followed by the specialty pharmacy service for RxSp Psoriasis   Patient's specialty medication(s) reviewed today: Bimekizumab-Bkzx   Missed doses in the last 4 weeks: 0   Patient/Caregiver did not have any additional questions or concerns.   Therapeutic benefit summary: Patient is achieving benefit   Adverse events/side effects summary: No adverse events/side effects   Patient's therapy is appropriate to: Continue    Goals Addressed             This Visit's Progress    Reduce signs and symptoms       Patient is on track. Patient will maintain adherence         Follow up:  6 months  Otto Herb Specialty Pharmacist

## 2023-10-25 NOTE — Progress Notes (Signed)
Specialty Pharmacy Refill Coordination Note  Norma Hickman is a 62 y.o. female contacted today regarding refills of specialty medication(s) Bimekizumab-Bkzx   Patient requested Delivery   Delivery date: 11/30/23   Verified address: [address] [place] [address]   Medication will be filled on 11/29/23.   Based on previous fills, patient should now be on every 8 week dosing. Sent refill request.

## 2023-10-26 ENCOUNTER — Other Ambulatory Visit (HOSPITAL_COMMUNITY): Payer: Self-pay

## 2023-10-31 ENCOUNTER — Other Ambulatory Visit: Payer: Self-pay

## 2023-11-05 ENCOUNTER — Other Ambulatory Visit: Payer: Self-pay

## 2023-11-05 ENCOUNTER — Ambulatory Visit
Admission: EM | Admit: 2023-11-05 | Discharge: 2023-11-05 | Disposition: A | Discharge status: Home / Self Care | Payer: 59 | Attending: Emergency Medicine | Admitting: Emergency Medicine

## 2023-11-05 DIAGNOSIS — J069 Acute upper respiratory infection, unspecified: Secondary | ICD-10-CM | POA: Diagnosis not present

## 2023-11-05 MED ORDER — PROMETHAZINE-DM 6.25-15 MG/5ML PO SYRP
5.0000 mL | ORAL_SOLUTION | Freq: Every evening | ORAL | 0 refills | Status: AC | PRN
  Filled 2023-11-05: qty 118, 23d supply, fill #0

## 2023-11-05 MED ORDER — BENZONATATE 100 MG PO CAPS
100.0000 mg | ORAL_CAPSULE | Freq: Three times a day (TID) | ORAL | 0 refills | Status: AC
  Filled 2023-11-05 (×2): qty 21, 7d supply, fill #0

## 2023-11-05 MED ORDER — AMOXICILLIN-POT CLAVULANATE 875-125 MG PO TABS: 1.0000 | ORAL_TABLET | Freq: Two times a day (BID) | ORAL | 0 refills | Status: AC

## 2023-11-05 MED ORDER — PREDNISONE 10 MG PO TABS
ORAL_TABLET | ORAL | 0 refills | Status: AC
  Filled 2023-11-05: qty 21, 6d supply, fill #0

## 2023-11-05 MED ORDER — AMOXICILLIN-POT CLAVULANATE 875-125 MG PO TABS
1.0000 | ORAL_TABLET | Freq: Two times a day (BID) | ORAL | 0 refills | Status: DC
  Filled 2023-11-05: qty 14, 7d supply, fill #0
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
## 2023-11-05 NOTE — Discharge Instructions (Signed)
Your symptoms today are most likely being caused by a virus and should steadily improve in time it can take up to 7 to 10 days before you truly start to see a turnaround however things will get better, if no improvement seen by Sunday you may begin use of Augmentin twice daily for 7 days  In the meantime begin use of prednisone which will open and relax the airway and settle shortness of breath wheezing and harshness of cough, take with food  You may use Tessalon pill every 8 hours as needed, may use cough syrup at bedtime to allow for rest    You can take Tylenol and/or Ibuprofen as needed for fever reduction and pain relief.   For cough: honey 1/2 to 1 teaspoon (you can dilute the honey in water or another fluid).  You can also use guaifenesin and dextromethorphan for cough. You can use a humidifier for chest congestion and cough.  If you don't have a humidifier, you can sit in the bathroom with the hot shower running.      For sore throat: try warm salt water gargles, cepacol lozenges, throat spray, warm tea or water with lemon/honey, popsicles or ice, or OTC cold relief medicine for throat discomfort.   For congestion: take a daily anti-histamine like Zyrtec, Claritin, and a oral decongestant, such as pseudoephedrine.  You can also use Flonase 1-2 sprays in each nostril daily.   It is important to stay hydrated: drink plenty of fluids (water, gatorade/powerade/pedialyte, juices, or teas) to keep your throat moisturized and help further relieve irritation/discomfort.

## 2023-11-05 NOTE — ED Triage Notes (Signed)
Pt reports dry cough, cold chills x 4 days. Fever a few days ago. Reports SOB with coughing.  Has taken Mucinex cold medicine. Can't sleep due to cough.

## 2023-11-06 ENCOUNTER — Other Ambulatory Visit: Payer: Self-pay

## 2023-11-09 ENCOUNTER — Other Ambulatory Visit: Payer: Self-pay

## 2023-11-15 ENCOUNTER — Other Ambulatory Visit: Payer: Self-pay

## 2023-11-15 NOTE — Progress Notes (Signed)
Pharmacy has not received refill request for Bimzlex. Faxed request was sent to office on 12/3 for Dr. Margarita Grizzle (prescriber). Pharmacy LVM on nurse line on 12/5 to follow up on refill request. Called patient today to confirm they are up to date on office visit and to confirm no other communication from the office. Confirmed office is [facility] [phone] and to direct the refill request to Dr. Isa Rankin. Called both the appointment line and LVM on the nurse's line attention Bethann Berkshire in regards to the following information above. Patient is scheduled for next injection Jan 1st.

## 2023-11-16 ENCOUNTER — Other Ambulatory Visit: Payer: Self-pay

## 2023-11-16 ENCOUNTER — Other Ambulatory Visit: Payer: Self-pay | Admitting: Pharmacist

## 2023-11-16 DIAGNOSIS — G4733 Obstructive sleep apnea (adult) (pediatric): Secondary | ICD-10-CM | POA: Diagnosis not present

## 2023-11-16 MED ORDER — BIMZELX 160 MG/ML ~~LOC~~ SOAJ: SUBCUTANEOUS | 6 refills | Status: DC

## 2023-11-16 MED ORDER — BIMZELX 160 MG/ML ~~LOC~~ SOAJ
SUBCUTANEOUS | 6 refills | Status: AC
  Filled 2023-11-16: qty 2, 28d supply, fill #0
  Filled 2024-01-24: qty 2, 28d supply, fill #1
  Filled 2024-03-19 (×2): qty 2, 28d supply, fill #2
  Filled 2024-06-02: qty 2, 28d supply, fill #3
  Filled 2024-07-24: qty 2, 28d supply, fill #4
  Filled 2024-09-24: qty 2, 28d supply, fill #5
  Filled 2024-11-13: qty 2, 28d supply, fill #6

## 2023-11-19 ENCOUNTER — Other Ambulatory Visit: Payer: Self-pay

## 2023-11-20 ENCOUNTER — Other Ambulatory Visit: Payer: Self-pay

## 2023-11-20 MED ORDER — TRAZODONE HCL 100 MG PO TABS
100.0000 mg | ORAL_TABLET | Freq: Every day | ORAL | 1 refills | Status: DC
  Filled 2023-11-20: qty 90, 90d supply, fill #0
  Filled 2024-02-10: qty 90, 90d supply, fill #1

## 2023-11-29 ENCOUNTER — Other Ambulatory Visit (HOSPITAL_COMMUNITY): Payer: Self-pay

## 2023-11-29 ENCOUNTER — Other Ambulatory Visit: Payer: Self-pay

## 2024-01-06 ENCOUNTER — Other Ambulatory Visit: Payer: Self-pay

## 2024-01-07 ENCOUNTER — Other Ambulatory Visit: Payer: Self-pay

## 2024-01-08 ENCOUNTER — Other Ambulatory Visit: Payer: Self-pay

## 2024-01-08 MED ORDER — ALLOPURINOL 100 MG PO TABS
100.0000 mg | ORAL_TABLET | Freq: Every day | ORAL | 2 refills | Status: DC
  Filled 2024-01-08: qty 30, 30d supply, fill #0
  Filled 2024-02-10: qty 30, 30d supply, fill #1
  Filled 2024-03-29: qty 30, 30d supply, fill #2

## 2024-01-11 ENCOUNTER — Other Ambulatory Visit: Payer: Self-pay

## 2024-01-16 DIAGNOSIS — L400 Psoriasis vulgaris: Secondary | ICD-10-CM | POA: Diagnosis not present

## 2024-01-21 ENCOUNTER — Other Ambulatory Visit: Payer: Self-pay

## 2024-01-22 ENCOUNTER — Other Ambulatory Visit (HOSPITAL_COMMUNITY): Payer: Self-pay

## 2024-01-24 ENCOUNTER — Other Ambulatory Visit: Payer: Self-pay

## 2024-01-24 NOTE — Progress Notes (Signed)
 Specialty Pharmacy Ongoing Clinical Assessment Note  Norma Hickman is a 63 y.o. female who is being followed by the specialty pharmacy service for RxSp Psoriasis   Patient's specialty medication(s) reviewed today: Bimekizumab-bkzx (Bimzelx)   Missed doses in the last 4 weeks: 0   Patient/Caregiver did not have any additional questions or concerns.   Therapeutic benefit summary: Patient is achieving benefit   Adverse events/side effects summary: No adverse events/side effects   Patient's therapy is appropriate to: Continue    Goals Addressed             This Visit's Progress    Reduce signs and symptoms   On track    Patient is on track. Patient will maintain adherence         Follow up:  6 months  Otto Herb Specialty Pharmacist

## 2024-01-24 NOTE — Progress Notes (Signed)
 Specialty Pharmacy Refill Coordination Note  Norma Hickman is a 63 y.o. female contacted today regarding refills of specialty medication(s) Bimekizumab-bkzx (Bimzelx)   Patient requested Delivery   Delivery date: 01/31/24   Verified address: [address] [place] [address]   Medication will be filled on 01/30/24.

## 2024-01-30 ENCOUNTER — Other Ambulatory Visit: Payer: Self-pay

## 2024-01-30 ENCOUNTER — Other Ambulatory Visit (HOSPITAL_COMMUNITY): Payer: Self-pay

## 2024-02-05 ENCOUNTER — Other Ambulatory Visit: Payer: Self-pay

## 2024-02-08 ENCOUNTER — Other Ambulatory Visit: Payer: Self-pay

## 2024-02-10 ENCOUNTER — Other Ambulatory Visit: Payer: Self-pay

## 2024-02-11 ENCOUNTER — Other Ambulatory Visit: Payer: Self-pay

## 2024-02-11 MED ORDER — GABAPENTIN 300 MG PO CAPS
300.0000 mg | ORAL_CAPSULE | Freq: Every day | ORAL | 1 refills | Status: DC
  Filled 2024-02-11 – 2024-02-26 (×2): qty 90, 90d supply, fill #0
  Filled 2024-06-02: qty 90, 90d supply, fill #1

## 2024-02-15 DIAGNOSIS — G4733 Obstructive sleep apnea (adult) (pediatric): Secondary | ICD-10-CM | POA: Diagnosis not present

## 2024-02-26 ENCOUNTER — Other Ambulatory Visit: Payer: Self-pay

## 2024-02-26 ENCOUNTER — Other Ambulatory Visit (HOSPITAL_COMMUNITY): Payer: Self-pay

## 2024-02-26 DIAGNOSIS — L400 Psoriasis vulgaris: Secondary | ICD-10-CM | POA: Diagnosis not present

## 2024-02-26 DIAGNOSIS — L309 Dermatitis, unspecified: Secondary | ICD-10-CM | POA: Diagnosis not present

## 2024-02-26 MED ORDER — DERMA-SMOOTHE/FS BODY 0.01 % EX OIL
1.0000 | TOPICAL_OIL | CUTANEOUS | 1 refills | Status: AC
  Filled 2024-02-26: qty 118.28, 30d supply, fill #0
  Filled 2024-03-29: qty 118.28, 30d supply, fill #1

## 2024-02-26 MED ORDER — DERMA-SMOOTHE/FS BODY 0.01 % EX OIL: TOPICAL_OIL | CUTANEOUS | 1 refills | Status: AC

## 2024-02-27 ENCOUNTER — Other Ambulatory Visit (HOSPITAL_COMMUNITY): Payer: Self-pay

## 2024-03-18 ENCOUNTER — Other Ambulatory Visit: Payer: Self-pay

## 2024-03-19 ENCOUNTER — Other Ambulatory Visit (HOSPITAL_COMMUNITY): Payer: Self-pay

## 2024-03-19 ENCOUNTER — Other Ambulatory Visit: Payer: Self-pay

## 2024-03-19 NOTE — Progress Notes (Signed)
 Specialty Pharmacy Refill Coordination Note  Norma Hickman is a 63 y.o. female contacted today regarding refills of specialty medication(s) Bimzelx.  Patient requested (Patient-Rptd) Delivery   Delivery date: (Patient-Rptd) 04/03/24   Verified address: (Patient-Rptd) [address]   Medication will be filled on 04/02/24.

## 2024-03-29 ENCOUNTER — Other Ambulatory Visit: Payer: Self-pay

## 2024-03-30 ENCOUNTER — Other Ambulatory Visit: Payer: Self-pay

## 2024-03-31 ENCOUNTER — Other Ambulatory Visit: Payer: Self-pay

## 2024-04-01 ENCOUNTER — Other Ambulatory Visit: Payer: Self-pay

## 2024-04-02 ENCOUNTER — Other Ambulatory Visit: Payer: Self-pay

## 2024-04-02 MED ORDER — KETOCONAZOLE 2 % EX SHAM
MEDICATED_SHAMPOO | CUTANEOUS | 3 refills | Status: DC
  Filled 2024-04-02: qty 120, 30d supply, fill #0
  Filled 2024-05-08: qty 120, 30d supply, fill #1
  Filled 2024-06-02: qty 120, 30d supply, fill #2
  Filled 2024-07-17: qty 120, 30d supply, fill #3

## 2024-04-16 ENCOUNTER — Other Ambulatory Visit: Payer: Self-pay

## 2024-04-16 DIAGNOSIS — D2272 Melanocytic nevi of left lower limb, including hip: Secondary | ICD-10-CM | POA: Diagnosis not present

## 2024-04-16 DIAGNOSIS — D2271 Melanocytic nevi of right lower limb, including hip: Secondary | ICD-10-CM | POA: Diagnosis not present

## 2024-04-16 DIAGNOSIS — D2261 Melanocytic nevi of right upper limb, including shoulder: Secondary | ICD-10-CM | POA: Diagnosis not present

## 2024-04-16 DIAGNOSIS — L821 Other seborrheic keratosis: Secondary | ICD-10-CM | POA: Diagnosis not present

## 2024-04-16 DIAGNOSIS — L400 Psoriasis vulgaris: Secondary | ICD-10-CM | POA: Diagnosis not present

## 2024-04-16 DIAGNOSIS — D2262 Melanocytic nevi of left upper limb, including shoulder: Secondary | ICD-10-CM | POA: Diagnosis not present

## 2024-04-16 DIAGNOSIS — D225 Melanocytic nevi of trunk: Secondary | ICD-10-CM | POA: Diagnosis not present

## 2024-04-16 MED ORDER — TRIAMCINOLONE ACETONIDE 0.1 % EX CREA
1.0000 | TOPICAL_CREAM | Freq: Two times a day (BID) | CUTANEOUS | 1 refills | Status: AC
  Filled 2024-04-16: qty 454, 90d supply, fill #0
  Filled 2024-09-16: qty 454, 90d supply, fill #1

## 2024-04-17 ENCOUNTER — Other Ambulatory Visit: Payer: Self-pay

## 2024-04-21 ENCOUNTER — Other Ambulatory Visit: Payer: Self-pay

## 2024-04-21 DIAGNOSIS — M545 Low back pain, unspecified: Secondary | ICD-10-CM | POA: Diagnosis not present

## 2024-04-21 MED ORDER — CYCLOBENZAPRINE HCL 5 MG PO TABS
5.0000 mg | ORAL_TABLET | Freq: Two times a day (BID) | ORAL | 0 refills | Status: AC | PRN
  Filled 2024-04-21: qty 14, 7d supply, fill #0

## 2024-04-21 MED ORDER — MELOXICAM 7.5 MG PO TABS
7.5000 mg | ORAL_TABLET | Freq: Every day | ORAL | 0 refills | Status: DC
  Filled 2024-04-21: qty 30, 30d supply, fill #0

## 2024-04-22 ENCOUNTER — Other Ambulatory Visit: Payer: Self-pay

## 2024-04-22 MED ORDER — CLOBETASOL PROPIONATE 0.05 % EX SOLN
Freq: Two times a day (BID) | CUTANEOUS | 1 refills | Status: AC
  Filled 2024-04-22: qty 100, 30d supply, fill #0

## 2024-05-02 ENCOUNTER — Other Ambulatory Visit: Payer: Self-pay

## 2024-05-08 ENCOUNTER — Other Ambulatory Visit: Payer: Self-pay

## 2024-05-08 MED ORDER — ALLOPURINOL 100 MG PO TABS
100.0000 mg | ORAL_TABLET | Freq: Every day | ORAL | 2 refills | Status: AC
  Filled 2024-05-08: qty 90, 90d supply, fill #0

## 2024-05-08 MED ORDER — TRAZODONE HCL 100 MG PO TABS
100.0000 mg | ORAL_TABLET | Freq: Every day | ORAL | 1 refills | Status: DC
  Filled 2024-05-08: qty 90, 90d supply, fill #0
  Filled 2024-08-24: qty 90, 90d supply, fill #1

## 2024-05-09 ENCOUNTER — Other Ambulatory Visit: Payer: Self-pay

## 2024-05-17 DIAGNOSIS — G4733 Obstructive sleep apnea (adult) (pediatric): Secondary | ICD-10-CM | POA: Diagnosis not present

## 2024-05-27 ENCOUNTER — Encounter: Payer: Self-pay | Admitting: Pharmacist

## 2024-05-27 ENCOUNTER — Ambulatory Visit: Attending: Physician Assistant | Admitting: Pharmacist

## 2024-05-27 DIAGNOSIS — Z7189 Other specified counseling: Secondary | ICD-10-CM

## 2024-05-27 DIAGNOSIS — L409 Psoriasis, unspecified: Secondary | ICD-10-CM

## 2024-05-27 NOTE — Progress Notes (Signed)
   S: Patient presents today for review of their specialty medication.   Patient is currently taking Bimzelx  for psoriasis. Patient is managed by Dr. Dela for this.   Dosing: 320 mg subcutaneous given at weeks 0, 4, 8, 12, and 16 followed by 320 mg once every 4 weeks thereafter.   Adherence: confirmed   Efficacy: reports that the medication is working well for her.   Monitoring:  -TB testing: completed  -Liver enzymes: wnl (09/11/2023) -S/sx of injection site reaction: none  Current adverse effects: -Injection site reaction: none -GI side effects: none -HA: none -Oral candidiasis: none -Psychiatric effects: none  O: Lab Results  Component Value Date   WBC 11.1 (H) 11/13/2022   HGB 13.5 11/13/2022   HCT 42.4 11/13/2022   MCV 95.7 11/13/2022   PLT 241 11/13/2022      Chemistry      Component Value Date/Time   NA 141 04/20/2023 1117   NA 136 11/28/2012 0443   K 4.0 04/20/2023 1117   K 3.7 11/28/2012 0443   CL 106 04/20/2023 1117   CL 104 11/28/2012 0443   CO2 25 04/20/2023 1117   CO2 24 11/28/2012 0443   BUN 26 (H) 04/20/2023 1117   BUN 18 11/28/2012 0443   CREATININE 1.14 (H) 04/20/2023 1117   CREATININE 0.89 11/28/2012 0443      Component Value Date/Time   CALCIUM  9.4 04/20/2023 1117   CALCIUM  8.3 (L) 11/28/2012 0443   ALKPHOS 50 04/20/2023 1117   AST 39 04/20/2023 1117   ALT 36 04/20/2023 1117   BILITOT 0.7 04/20/2023 1117      A/P: 1. Medication review: patient currently on Bimzelx  for psoriasis. She is managed by Dr. Dela for this. She is tolerating it well with okay efficacy so far. Reviewed the medication with her including the following: bimekizumab  is a human monoclonal Ab that selectively binds to IL-17A, IL-31F, and 17-AF cytokines. This decreases the release of proinflammatory cytokines and decreases inflammation involved in psoriasis. It is supplies as an autoinjector and PFS. It should be stored in the fridge but may be removed before  injection to allow to reach RT prior to administration. Injection site reaction, GI side effects, and headaches can occur but are not common. Rarely, Bimzelx  can precipitate elevations in LFTs, psychiatric changes, or increase infection risks. Patient with no questions or concerns at this time. I have no recommendation for any changes.   Norma Hickman, PharmD, Norma Hickman, CPP Clinical Pharmacist [facility] [phone]

## 2024-05-29 ENCOUNTER — Other Ambulatory Visit: Payer: Self-pay

## 2024-06-02 ENCOUNTER — Other Ambulatory Visit: Payer: Self-pay

## 2024-06-02 ENCOUNTER — Other Ambulatory Visit (HOSPITAL_COMMUNITY): Payer: Self-pay

## 2024-06-02 MED ORDER — LISINOPRIL-HYDROCHLOROTHIAZIDE 20-12.5 MG PO TABS
2.0000 | ORAL_TABLET | Freq: Every day | ORAL | 1 refills | Status: AC
  Filled 2024-06-02: qty 180, 90d supply, fill #0
  Filled 2024-09-28: qty 180, 90d supply, fill #1

## 2024-06-02 NOTE — Progress Notes (Signed)
 Specialty Pharmacy Refill Coordination Note  Spoke with Szumski, Cristol L (Self)   Norma Hickman is a 63 y.o. female contacted today regarding refills of specialty medication(s) Bimekizumab -bkzx (Bimzelx )  Injection date: 06/05/24.   Patient requested: Delivery   Delivery date: 06/04/24   Verified address: [address]  [place] [address]  Medication will be filled on 06/03/24. Patient is aware that medication will have to be ordered.

## 2024-06-03 ENCOUNTER — Other Ambulatory Visit: Payer: Self-pay

## 2024-06-13 ENCOUNTER — Other Ambulatory Visit: Payer: Self-pay

## 2024-06-13 DIAGNOSIS — M7062 Trochanteric bursitis, left hip: Secondary | ICD-10-CM | POA: Diagnosis not present

## 2024-06-13 DIAGNOSIS — M79605 Pain in left leg: Secondary | ICD-10-CM | POA: Diagnosis not present

## 2024-06-13 MED ORDER — MELOXICAM 7.5 MG PO TABS
7.5000 mg | ORAL_TABLET | Freq: Every day | ORAL | 1 refills | Status: AC
  Filled 2024-06-13: qty 30, 30d supply, fill #0
  Filled 2024-07-17: qty 30, 30d supply, fill #1

## 2024-07-11 ENCOUNTER — Other Ambulatory Visit: Payer: Self-pay

## 2024-07-11 DIAGNOSIS — H00012 Hordeolum externum right lower eyelid: Secondary | ICD-10-CM | POA: Diagnosis not present

## 2024-07-11 DIAGNOSIS — H0012 Chalazion right lower eyelid: Secondary | ICD-10-CM | POA: Diagnosis not present

## 2024-07-11 MED ORDER — MAXITROL 3.5-10000-0.1 OP OINT
TOPICAL_OINTMENT | Freq: Three times a day (TID) | OPHTHALMIC | 0 refills | Status: AC
  Filled 2024-07-11: qty 3.5, 7d supply, fill #0

## 2024-07-17 ENCOUNTER — Other Ambulatory Visit: Payer: Self-pay

## 2024-07-18 ENCOUNTER — Other Ambulatory Visit: Payer: Self-pay

## 2024-07-22 ENCOUNTER — Other Ambulatory Visit (HOSPITAL_COMMUNITY): Payer: Self-pay

## 2024-07-24 ENCOUNTER — Other Ambulatory Visit: Payer: Self-pay

## 2024-07-28 ENCOUNTER — Other Ambulatory Visit: Payer: Self-pay

## 2024-07-28 NOTE — Progress Notes (Signed)
 Specialty Pharmacy Refill Coordination Note  Norma Hickman is a 63 y.o. female contacted today regarding refills of specialty medication(s) Bimekizumab -bkzx (Bimzelx )   Patient requested Delivery   Delivery date: 08/01/24   Verified address: [address]  [place] [address]   Medication will be filled on 08.28.25.

## 2024-07-30 ENCOUNTER — Other Ambulatory Visit: Payer: Self-pay

## 2024-08-18 DIAGNOSIS — G4733 Obstructive sleep apnea (adult) (pediatric): Secondary | ICD-10-CM | POA: Diagnosis not present

## 2024-08-21 ENCOUNTER — Other Ambulatory Visit (HOSPITAL_COMMUNITY): Payer: Self-pay

## 2024-08-24 ENCOUNTER — Other Ambulatory Visit: Payer: Self-pay

## 2024-08-25 ENCOUNTER — Other Ambulatory Visit (HOSPITAL_COMMUNITY): Payer: Self-pay

## 2024-08-25 ENCOUNTER — Other Ambulatory Visit: Payer: Self-pay

## 2024-08-25 MED ORDER — KETOCONAZOLE 2 % EX SHAM
MEDICATED_SHAMPOO | CUTANEOUS | 3 refills | Status: AC
  Filled 2024-08-25: qty 120, 30d supply, fill #0
  Filled 2024-09-29: qty 120, 30d supply, fill #1
  Filled 2024-11-20: qty 120, 30d supply, fill #2
  Filled 2024-12-24: qty 120, 30d supply, fill #3

## 2024-08-25 MED ORDER — GABAPENTIN 300 MG PO CAPS
300.0000 mg | ORAL_CAPSULE | Freq: Every day | ORAL | 0 refills | Status: AC
Start: 2024-08-25 — End: ?
  Filled 2024-08-25: qty 90, 90d supply, fill #0

## 2024-08-25 MED ORDER — ALLOPURINOL 100 MG PO TABS
100.0000 mg | ORAL_TABLET | Freq: Every day | ORAL | 2 refills | Status: AC
  Filled 2024-08-25: qty 30, 30d supply, fill #0
  Filled 2024-11-20: qty 30, 30d supply, fill #1
  Filled 2024-12-13 – 2024-12-24 (×4): qty 30, 30d supply, fill #2

## 2024-08-26 ENCOUNTER — Other Ambulatory Visit: Payer: Self-pay

## 2024-09-16 ENCOUNTER — Other Ambulatory Visit: Payer: Self-pay

## 2024-09-17 ENCOUNTER — Other Ambulatory Visit: Payer: Self-pay

## 2024-09-22 ENCOUNTER — Other Ambulatory Visit: Payer: Self-pay

## 2024-09-24 ENCOUNTER — Other Ambulatory Visit (HOSPITAL_COMMUNITY): Payer: Self-pay

## 2024-09-26 ENCOUNTER — Other Ambulatory Visit: Payer: Self-pay

## 2024-09-26 NOTE — Progress Notes (Signed)
 Specialty Pharmacy Refill Coordination Note  CARRELL PALMATIER is a 63 y.o. female contacted today regarding refills of specialty medication(s) Bimekizumab -bkzx (Bimzelx )   Patient requested Delivery   Delivery date: 09/30/24   Verified address: [address]  [place] [address]   Medication will be filled on 09/29/24.

## 2024-09-28 ENCOUNTER — Other Ambulatory Visit: Payer: Self-pay

## 2024-09-29 ENCOUNTER — Other Ambulatory Visit: Payer: Self-pay

## 2024-10-20 ENCOUNTER — Other Ambulatory Visit: Payer: Self-pay

## 2024-10-22 ENCOUNTER — Other Ambulatory Visit: Payer: Self-pay

## 2024-10-22 NOTE — Progress Notes (Signed)
 Specialty Pharmacy Ongoing Clinical Assessment Note  Norma Hickman is a 63 y.o. female who is being followed by the specialty pharmacy service for RxSp Psoriasis   Patient's specialty medication(s) reviewed today: Bimekizumab -bkzx (Bimzelx )   Missed doses in the last 4 weeks: 0   Patient/Caregiver did not have any additional questions or concerns.   Therapeutic benefit summary: Patient is NOT achieving benefit   Adverse events/side effects summary: No adverse events/side effects   Patient's therapy is appropriate to: Continue    Goals Addressed             This Visit's Progress    Reduce signs and symptoms   Worsening    Patient is not on track and worsening. Patient will maintain adherence and be monitored by provider to determine if a change in treatment plan is warranted. Patient states that she does not feel that her psoriasis is under control. She has severe itching on her scalp, stomach and underarms. Provider gave her a cream that helps occasionally but nothing for her scalp. She stated that her provider is trying to refer her to a specialist for this.         Follow up: 12 months  [facility]

## 2024-11-13 ENCOUNTER — Other Ambulatory Visit: Payer: Self-pay

## 2024-11-13 ENCOUNTER — Other Ambulatory Visit: Payer: Self-pay | Admitting: Pharmacy Technician

## 2024-11-13 ENCOUNTER — Telehealth: Payer: Self-pay

## 2024-11-13 NOTE — Telephone Encounter (Signed)
 Pharmacy Patient Advocate Encounter   Received notification from Patient Pharmacy that prior authorization for Bimzelx  is required/requested.   Insurance verification completed.   The patient is insured through [facility].   Per test claim: PA required; PA submitted to above mentioned insurance via Latent Key/confirmation #/EOC [id number] Status is pending

## 2024-11-13 NOTE — Telephone Encounter (Signed)
 Pharmacy Patient Advocate Encounter  Received notification from [facility] that Prior Authorization for Bimzelx  has been APPROVED from 11/13/24 to 11/12/25   PA #/Case ID/Reference #: [id number]

## 2024-11-17 ENCOUNTER — Other Ambulatory Visit: Payer: Self-pay

## 2024-11-18 ENCOUNTER — Other Ambulatory Visit: Payer: Self-pay

## 2024-11-18 DIAGNOSIS — G4733 Obstructive sleep apnea (adult) (pediatric): Secondary | ICD-10-CM | POA: Diagnosis not present

## 2024-11-19 ENCOUNTER — Other Ambulatory Visit (HOSPITAL_COMMUNITY): Payer: Self-pay

## 2024-11-20 ENCOUNTER — Other Ambulatory Visit (HOSPITAL_COMMUNITY): Payer: Self-pay

## 2024-11-20 ENCOUNTER — Other Ambulatory Visit: Payer: Self-pay

## 2024-11-20 MED ORDER — GABAPENTIN 300 MG PO CAPS
300.0000 mg | ORAL_CAPSULE | Freq: Every day | ORAL | 0 refills | Status: DC
  Filled 2024-11-20 (×2): qty 90, 90d supply, fill #0

## 2024-11-20 MED ORDER — TRAZODONE HCL 100 MG PO TABS
100.0000 mg | ORAL_TABLET | Freq: Every day | ORAL | 0 refills | Status: DC
  Filled 2024-11-20 (×2): qty 90, 90d supply, fill #0

## 2024-11-21 ENCOUNTER — Other Ambulatory Visit: Payer: Self-pay

## 2024-11-24 ENCOUNTER — Other Ambulatory Visit: Payer: Self-pay

## 2024-11-24 DIAGNOSIS — Z1211 Encounter for screening for malignant neoplasm of colon: Secondary | ICD-10-CM | POA: Diagnosis not present

## 2024-11-24 DIAGNOSIS — E785 Hyperlipidemia, unspecified: Secondary | ICD-10-CM | POA: Diagnosis not present

## 2024-11-24 DIAGNOSIS — Z0000 Encounter for general adult medical examination without abnormal findings: Secondary | ICD-10-CM | POA: Diagnosis not present

## 2024-11-24 DIAGNOSIS — R7303 Prediabetes: Secondary | ICD-10-CM | POA: Diagnosis not present

## 2024-11-24 DIAGNOSIS — I10 Essential (primary) hypertension: Secondary | ICD-10-CM | POA: Diagnosis not present

## 2024-11-24 DIAGNOSIS — Z1331 Encounter for screening for depression: Secondary | ICD-10-CM | POA: Diagnosis not present

## 2024-11-24 MED ORDER — GABAPENTIN 300 MG PO CAPS
300.0000 mg | ORAL_CAPSULE | Freq: Every day | ORAL | 0 refills | Status: AC
  Filled 2024-11-24: qty 90, 90d supply, fill #0

## 2024-11-24 MED ORDER — TRAZODONE HCL 100 MG PO TABS: 100.0000 mg | ORAL_TABLET | Freq: Every day | ORAL | 1 refills | Status: AC

## 2024-11-25 ENCOUNTER — Other Ambulatory Visit: Payer: Self-pay

## 2024-11-26 ENCOUNTER — Other Ambulatory Visit: Payer: Self-pay

## 2024-11-28 ENCOUNTER — Other Ambulatory Visit (HOSPITAL_COMMUNITY): Payer: Self-pay

## 2024-12-01 ENCOUNTER — Other Ambulatory Visit (HOSPITAL_COMMUNITY): Payer: Self-pay

## 2024-12-01 ENCOUNTER — Other Ambulatory Visit: Payer: Self-pay

## 2024-12-13 ENCOUNTER — Other Ambulatory Visit: Payer: Self-pay

## 2024-12-14 ENCOUNTER — Other Ambulatory Visit: Payer: Self-pay

## 2024-12-14 MED ORDER — ESCITALOPRAM OXALATE 10 MG PO TABS
10.0000 mg | ORAL_TABLET | Freq: Every day | ORAL | 1 refills | Status: AC
  Filled 2024-12-14: qty 90, 90d supply, fill #0

## 2024-12-15 ENCOUNTER — Other Ambulatory Visit (HOSPITAL_COMMUNITY): Payer: Self-pay

## 2024-12-15 ENCOUNTER — Other Ambulatory Visit: Payer: Self-pay

## 2024-12-24 ENCOUNTER — Other Ambulatory Visit: Payer: Self-pay

## 2024-12-24 MED ORDER — CLOBETASOL PROPIONATE 0.05 % EX CREA
TOPICAL_CREAM | CUTANEOUS | 1 refills | Status: AC
  Filled 2024-12-24: qty 60, 60d supply, fill #0

## 2024-12-24 MED ORDER — TRIAMCINOLONE ACETONIDE 0.1 % EX CREA
1.0000 | TOPICAL_CREAM | Freq: Two times a day (BID) | CUTANEOUS | 1 refills | Status: AC
  Filled 2024-12-24: qty 454, 90d supply, fill #0
